# Patient Record
Sex: Female | Born: 1980 | Race: White | Hispanic: Yes | Marital: Married | Smoking: Never smoker
Health system: Southern US, Community
[De-identification: ages and names within clinical notes are randomized; demographics above are authoritative.]

## PROBLEM LIST (undated history)

## (undated) ENCOUNTER — Inpatient Hospital Stay (HOSPITAL_COMMUNITY): Payer: Self-pay

---

## 2004-01-20 ENCOUNTER — Ambulatory Visit (HOSPITAL_COMMUNITY): Admission: RE | Admit: 2004-01-20 | Discharge: 2004-01-20 | Payer: Self-pay | Admitting: Obstetrics and Gynecology

## 2004-02-15 ENCOUNTER — Ambulatory Visit (HOSPITAL_COMMUNITY): Admission: RE | Admit: 2004-02-15 | Discharge: 2004-02-15 | Payer: Self-pay | Admitting: *Deleted

## 2004-07-04 ENCOUNTER — Ambulatory Visit: Payer: Self-pay | Admitting: Family Medicine

## 2004-07-04 ENCOUNTER — Encounter (INDEPENDENT_AMBULATORY_CARE_PROVIDER_SITE_OTHER): Payer: Self-pay | Admitting: Specialist

## 2004-07-04 ENCOUNTER — Inpatient Hospital Stay (HOSPITAL_COMMUNITY): Admission: RE | Admit: 2004-07-04 | Discharge: 2004-07-07 | Payer: Self-pay | Admitting: Family Medicine

## 2005-05-25 ENCOUNTER — Emergency Department (HOSPITAL_COMMUNITY): Admission: EM | Admit: 2005-05-25 | Discharge: 2005-05-26 | Payer: Self-pay | Admitting: Emergency Medicine

## 2005-10-22 ENCOUNTER — Emergency Department (HOSPITAL_COMMUNITY): Admission: EM | Admit: 2005-10-22 | Discharge: 2005-10-22 | Payer: Self-pay | Admitting: Emergency Medicine

## 2005-10-24 ENCOUNTER — Encounter: Payer: Self-pay | Admitting: Emergency Medicine

## 2005-10-25 ENCOUNTER — Inpatient Hospital Stay (HOSPITAL_COMMUNITY): Admission: EM | Admit: 2005-10-25 | Discharge: 2005-10-26 | Payer: Self-pay | Admitting: Internal Medicine

## 2006-07-08 ENCOUNTER — Emergency Department (HOSPITAL_COMMUNITY): Admission: EM | Admit: 2006-07-08 | Discharge: 2006-07-08 | Payer: Self-pay | Admitting: Emergency Medicine

## 2009-07-24 ENCOUNTER — Emergency Department (HOSPITAL_COMMUNITY): Admission: EM | Admit: 2009-07-24 | Discharge: 2009-07-24 | Payer: Self-pay | Admitting: Emergency Medicine

## 2009-09-04 ENCOUNTER — Emergency Department (HOSPITAL_COMMUNITY): Admission: EM | Admit: 2009-09-04 | Discharge: 2009-09-04 | Payer: Self-pay | Admitting: Emergency Medicine

## 2010-08-28 ENCOUNTER — Encounter: Payer: Self-pay | Admitting: *Deleted

## 2010-10-23 LAB — COMPREHENSIVE METABOLIC PANEL
ALT: 16 U/L (ref 0–35)
AST: 18 U/L (ref 0–37)
Albumin: 3.3 g/dL — ABNORMAL LOW (ref 3.5–5.2)
Alkaline Phosphatase: 65 U/L (ref 39–117)
BUN: 11 mg/dL (ref 6–23)
CO2: 23 mEq/L (ref 19–32)
Calcium: 8.9 mg/dL (ref 8.4–10.5)
Chloride: 103 mEq/L (ref 96–112)
Creatinine, Ser: 0.61 mg/dL (ref 0.4–1.2)
GFR calc Af Amer: >60 mL/min (ref >60)
GFR calc non Af Amer: >60 mL/min (ref >60)
Glucose, Bld: 98 mg/dL (ref 70–99)
Potassium: 3.7 mEq/L (ref 3.5–5.1)
Sodium: 135 mEq/L (ref 135–145)
Total Bilirubin: 1.2 mg/dL (ref 0.3–1.2)
Total Protein: 7.4 g/dL (ref 6.0–8.3)

## 2010-10-23 LAB — URINE MICROSCOPIC-ADD ON

## 2010-10-23 LAB — CBC
HCT: 36.7 % (ref 36.0–46.0)
Hemoglobin: 12.1 g/dL (ref 12.0–15.0)
MCHC: 33 g/dL (ref 30.0–36.0)
MCV: 88.8 fL (ref 78.0–100.0)
Platelets: 226 10*3/uL (ref 150–400)
RBC: 4.13 MIL/uL (ref 3.87–5.11)
RDW: 13.5 % (ref 11.5–15.5)
WBC: 15.3 10*3/uL — ABNORMAL HIGH (ref 4.0–10.5)

## 2010-10-23 LAB — DIFFERENTIAL
Basophils Absolute: 0 10*3/uL (ref 0.0–0.1)
Basophils Relative: 0 % (ref 0–1)
Eosinophils Absolute: 0 10*3/uL (ref 0.0–0.7)
Eosinophils Relative: 0 % (ref 0–5)
Lymphocytes Relative: 10 % — ABNORMAL LOW (ref 12–46)
Lymphs Abs: 1.5 10*3/uL (ref 0.7–4.0)
Monocytes Absolute: 1.5 10*3/uL — ABNORMAL HIGH (ref 0.1–1.0)
Monocytes Relative: 10 % (ref 3–12)
Neutro Abs: 12.3 10*3/uL — ABNORMAL HIGH (ref 1.7–7.7)
Neutrophils Relative %: 81 % — ABNORMAL HIGH (ref 43–77)

## 2010-10-23 LAB — URINALYSIS, ROUTINE W REFLEX MICROSCOPIC
Bilirubin Urine: NEGATIVE
Glucose, UA: NEGATIVE mg/dL
Ketones, ur: 40 mg/dL — AB
Nitrite: POSITIVE — AB
Protein, ur: 30 mg/dL — AB
Specific Gravity, Urine: 1.017 (ref 1.005–1.030)
Urobilinogen, UA: 1 mg/dL (ref 0.0–1.0)
pH: 7.5 (ref 5.0–8.0)

## 2010-10-23 LAB — POCT PREGNANCY, URINE: Preg Test, Ur: NEGATIVE

## 2010-10-23 LAB — LIPASE, BLOOD: Lipase: 17 U/L (ref 11–59)

## 2010-11-07 LAB — COMPREHENSIVE METABOLIC PANEL
ALT: 15 U/L (ref 0–35)
AST: 20 U/L (ref 0–37)
Albumin: 3.4 g/dL — ABNORMAL LOW (ref 3.5–5.2)
Alkaline Phosphatase: 34 U/L — ABNORMAL LOW (ref 39–117)
BUN: 8 mg/dL (ref 6–23)
CO2: 23 mEq/L (ref 19–32)
Calcium: 8.9 mg/dL (ref 8.4–10.5)
Chloride: 107 mEq/L (ref 96–112)
Creatinine, Ser: 0.68 mg/dL (ref 0.4–1.2)
GFR calc Af Amer: >60 mL/min (ref >60)
GFR calc non Af Amer: >60 mL/min (ref >60)
Glucose, Bld: 100 mg/dL — ABNORMAL HIGH (ref 70–99)
Potassium: 3.5 mEq/L (ref 3.5–5.1)
Sodium: 138 mEq/L (ref 135–145)
Total Bilirubin: 0.4 mg/dL (ref 0.3–1.2)
Total Protein: 6.8 g/dL (ref 6.0–8.3)

## 2010-11-07 LAB — DIFFERENTIAL
Basophils Absolute: 0 10*3/uL (ref 0.0–0.1)
Basophils Relative: 1 % (ref 0–1)
Eosinophils Absolute: 0 10*3/uL (ref 0.0–0.7)
Eosinophils Relative: 1 % (ref 0–5)
Lymphocytes Relative: 35 % (ref 12–46)
Lymphs Abs: 2.9 10*3/uL (ref 0.7–4.0)
Monocytes Absolute: 0.4 10*3/uL (ref 0.1–1.0)
Monocytes Relative: 5 % (ref 3–12)
Neutro Abs: 5.1 10*3/uL (ref 1.7–7.7)
Neutrophils Relative %: 60 % (ref 43–77)

## 2010-11-07 LAB — CBC
HCT: 35.4 % — ABNORMAL LOW (ref 36.0–46.0)
Hemoglobin: 11.7 g/dL — ABNORMAL LOW (ref 12.0–15.0)
MCHC: 32.9 g/dL (ref 30.0–36.0)
MCV: 88.9 fL (ref 78.0–100.0)
Platelets: 221 10*3/uL (ref 150–400)
RBC: 3.98 MIL/uL (ref 3.87–5.11)
RDW: 13.1 % (ref 11.5–15.5)
WBC: 8.5 10*3/uL (ref 4.0–10.5)

## 2010-11-07 LAB — URINALYSIS, ROUTINE W REFLEX MICROSCOPIC
Bilirubin Urine: NEGATIVE
Glucose, UA: NEGATIVE mg/dL
Hgb urine dipstick: NEGATIVE
Ketones, ur: NEGATIVE mg/dL
Nitrite: NEGATIVE
Protein, ur: NEGATIVE mg/dL
Specific Gravity, Urine: 1.033 — ABNORMAL HIGH (ref 1.005–1.030)
Urobilinogen, UA: 0.2 mg/dL (ref 0.0–1.0)
pH: 7 (ref 5.0–8.0)

## 2010-11-07 LAB — LIPASE, BLOOD: Lipase: 18 U/L (ref 11–59)

## 2010-11-07 LAB — POCT PREGNANCY, URINE: Preg Test, Ur: NEGATIVE

## 2010-12-23 NOTE — Op Note (Signed)
[name], [name]          ACCOUNT NO.:  [id number]   MEDICAL RECORD NO.:  [id number]          PATIENT TYPE:  INP   LOCATION:  9120                          FACILITY:  [facility]   PHYSICIAN:  Tanya S. Shawnie Pons, M.D.   DATE OF BIRTH:  08/23/1980   DATE OF PROCEDURE:  07/04/2004  DATE OF DISCHARGE:                                 OPERATIVE REPORT   PREOPERATIVE DIAGNOSES:  1.  Intrauterine pregnancy at 39 plus weeks.  2.  Previous cesarean delivery.   POSTOPERATIVE DIAGNOSES:  1.  Intrauterine pregnancy at 39 plus weeks.  2.  Previous cesarean delivery.   PROCEDURE:  Repeat low transverse cesarean section.   SURGEON:  Shelbie Proctor. Shawnie Pons, M.D.   ASSISTANTMichele Mcalpine D. Okey Dupre, M.D.   FINDINGS:  Viable female infant, Apgar's 8 and 9, weight 9 pounds 14 ounces.   ANESTHESIA:  Spinal, Dorinda Hill T. Pamalee Leyden, M.D.   ESTIMATED BLOOD LOSS:  1000 mL.   COMPLICATIONS:  None.   INDICATIONS FOR PROCEDURE:  The patient is a 30 year old, gravida 2, para 1  with a previous cesarean delivery who desired a repeat.   DESCRIPTION OF PROCEDURE:  The patient was taken to the OR where spinal  anesthesia was administered.  When this was done, she was prepped and draped  in the usual sterile fashion, Foley catheter used to drain her bladder.  Anesthesia was __________ and this was felt to be adequate.  A Pfannenstiel  incision was made in the lower abdomen. This incision was carried down to  the underlying fascia with the electrocautery. The fascia was incised with  the electrocautery.  Two Kocher clamps were then used to elevate the  superior and inferior edges of the fascia off the underlying rectus. This  was dissected bluntly laterally with the electrocautery in the midline. The  rectus muscle was separated and the peritoneal cavity identified, the  peritoneal cavity entered bluntly. This incision was extended with the  surgeon __________ incision.  A bladder blade was then placed inside the  perineum. The  bladder was noted to be up on the uterus so a bladder flap was  made.  A knife was then used to make a low transverse incision on the  uterus. This was carried down sharply in the midline until the amniotic  cavity was reached. The incision was then bluntly extended laterally and a  sharp clamp used to rupture membranes.  Amniotic fluid was clear, the infant  was vertex and OA.  It ws delivered easily through the incision. The infant  was bulb suctioned on the abdomen and the rest of the body delivered easily.  The cord was clamped x2, cut and taken to the pediatricians. Cord blood was  obtained, the placenta was delivered from the uterine cavity. The cavity was  cleaned with dry lap pads, the edges of the incision were grasped with ring  forceps and the incision closed with a single #0 Vicryl suture in a locked  running fashion.  When hemostasis was felt to be adequate, there was a rent  noted in the peritoneum covering the bladder. This was closed with  a 3-0  Monocryl running. The bladder itself had no rent in it.  Attention was then  turned to the fascia which was closed with a #0 Vicryl suture in a running  fashion. The subcutaneous tissue was irrigated, any bleeders were cauterized  and the skin closed using clips. The incision was then infiltrated with 10  mL of 0.25% Marcaine plain. All instrument, needle and lap counts were  correct x2. The patient was awakened and taken to the recovery room in  stable condition.      TSP/MEDQ  D:  07/04/2004  T:  07/04/2004  Job:  [id number]

## 2010-12-23 NOTE — H&P (Signed)
[name], [name] NO.:  [id number]   MEDICAL RECORD NO.:  [id number]          PATIENT TYPE:  EMS   LOCATION:  ED                           FACILITY:  [facility]   PHYSICIAN:  Hettie Holstein, D.O.    DATE OF BIRTH:  May 14, 1981   DATE OF ADMISSION:  10/24/2005  DATE OF DISCHARGE:                                HISTORY & PHYSICAL   PRIMARY CARE PHYSICIAN:  Unassigned.   CHIEF COMPLAINT:  Left quadrant pain and back pain.   HISTORY OF PRESENT ILLNESS:  [name] is a pleasant Spanish-speaking 30-  year-old gravida 3, para 2 female with a history of a urinary tract  infection in the past who developed flank pain and abdominal pain beginning  Friday, as well as back pain.  She was assessed Sunday in the department and  provided hydrocodone.  The pain persisted, and she developed some nausea and  vomiting, and was unable to hold food down and was febrile, and therefore  she re-presented again today.  She underwent evaluation in the emergency  department and had a urinalysis that revealed 21-50 WBCs and 21-50 RBCs,  positive leukocyte esterase.  Urine hCG was negative.  She had a WBC count  of 16,500, and she underwent CT evaluation that revealed bilateral patchy  abnormal renal hypodensities, especially confluent to the right kidney in  the lower pole, likely representing pyelonephritis.  There was no abscess  noted.  She was initiated on antiemetics, as well as ciprofloxacin.  She is  getting blood cultures and urine cultures obtained on her fluids.   PAST MEDICAL HISTORY:  She is gravida 3, para 2.  She denies other  complaints.  She had a urinary tract infection back in November of 2005.  Denied any previous history, with the exception of a repeat cesarean  section.   She had a urinary tract infection, according to the patient's husband, about  5-6 months ago.  In any event, on Friday, she developed symptoms that were  similar to this as noted in the past.   MEDICATIONS AT HOME:  Only the narcotics prescribed this past Sunday.   ALLERGIES:  No known drug allergies.   SOCIAL HISTORY:  The patient is a homemaker.  She denies tobacco or alcohol.   FAMILY HISTORY:  Noncontributory.  The patient's husband states that her  parents are alive and well.   REVIEW OF SYSTEMS:  Unremarkable, with the exception of nausea and vomiting,  flank pain and fevers.  Otherwise, unremarkable.   PHYSICAL EXAMINATION:  VITAL SIGNS:  The patient was febrile in the  emergency department with a temperature of 103.2 and a heart rate of 119,  100% O2 saturation on room air, blood pressure 98/54.  GENERAL:  The patient is alert.  She was febrile.  She was in no acute  distress.  CARDIOVASCULAR:  Normal S1 and S2.  Tachycardic.  No appreciable murmur.  LUNGS:  Clear to auscultation bilaterally.  ABDOMEN:  Soft.  It was tender to deep palpation.  She exhibited positive  Lloyd's.  EXTREMITIES:  No edema.  NEUROLOGIC:  Revealed her to  be euthymic and her affect was stable.  No  focal neurologic deficits.   LABORATORY DATA:  As noted above.  Elevated WBC of 16,500, BUN 27,  creatinine 1.3, glucose of 96, potassium of 3.3.  CT as described above.  Urine pregnancy test was negative.   ASSESSMENT:  1.  Pyelonephritis with volume depletion and tachycardia, as well as fever.  2.  Hypokalemia.  3.  Leukocytosis.   PLAN AT THIS TIME:  We are going to admit, administer IV fluids with  repletion of her electrolytes, administer IV antibiotics, culture her and  administer analgesia.      Hettie Holstein, D.O.  Electronically Signed     ESS/MEDQ  D:  10/24/2005  T:  10/25/2005  Job:  [id number]

## 2010-12-23 NOTE — Discharge Summary (Signed)
[name], [name]          ACCOUNT NO.:  [id number]   MEDICAL RECORD NO.:  [id number]          PATIENT TYPE:  INP   LOCATION:  5506                         FACILITY:  [facility]   PHYSICIAN:  Isidor Holts, M.D.  DATE OF BIRTH:  April 05, 1981   DATE OF ADMISSION:  10/25/2005  DATE OF DISCHARGE:                                 DISCHARGE SUMMARY   PRIMARY MEDICAL DOCTOR:  Unassigned.   DISCHARGE DIAGNOSES:  1.  Acute pyelonephritis.  2.  Vomiting/dehydration.   DISCHARGE MEDICATIONS:  1.  Ciprofloxacin 500 mg p.o. b.i.d. for 1 week only.  2.  Tylenol 1 g p.o. p.r.n. q.6h. for pain for 1 week only.   PROCEDURES:  Abdominal/pelvic CT scan dated October 24, 2005. This showed  dominant abnormal enhancement in both kidneys, especially in the right  kidney lower pole, but with patchy involving in the remainder of both  kidneys. Unremarkable appearance of the pelvis.   CONSULTATIONS:  None.   ADMISSION HISTORY:  As in H&P notes of October 24, 2005, dictated by Dr. Hannah Beat. However, in brief, this is a 30 year old female, gravida 3, para 2,  with previous history of recurrent UTIs - the last one was about 5-6 months  ago - who presents with left flank pain, nausea and vomiting. She was  evaluated in the emergency department and was found to have a temperature of  103.2 and urine sediment was positive, consistent with urinary tract  infection. She was admitted for further evaluation, investigation, and  management.   CLINICAL COURSE:  #1 - ACUTE PYELONEPHRITIS. This is a young multipara, with  previous history of urinary tract infections, who presents with left flank  pain and dysuria. She was managed with intravenous fluid hydration,  analgesics, and antibiotics, with satisfactory clinical response. Initial  white cell count was 16,500 on day of presentation. However, by October 26, 2005, white cell count had normalized at 7.7. The patient has been afebrile  since October 25, 2005,  and flank pain had quickly ameliorated.   #2 - VOMITING/DEHYDRATION. This is secondary to #1 above. The patient was  managed with IV fluid hydration, antiemetics, proton pump inhibitor  treatment, and electrolyte abnormalities were adequately corrected. She was  no longer vomiting as of October 25, 2005, and has remained asymptomatic from  the point of view of GI symptoms since. As of October 26, 2005, a.m. hydration  status had normalized and the patient was able to maintain adequate oral  intake. BUN was 27 and creatinine 1.3 on day of admission. As of October 26, 2005, BUN was 6 with a creatinine of 0.9.   DISPOSITION:  The patient was deemed to be sufficiently recovered and in  satisfactory condition to be discharged on October 26, 2005. She was quite  keen to go home. She has therefore been discharged accordingly.   DIET:  No restrictions.   ACTIVITY:  As tolerated.   WOUND CARE:  Not applicable.   PAIN MANAGEMENT:  Not applicable.   FOLLOWUP INSTRUCTIONS:  The patient has been instructed to establish and  follow up with a primary M.D. for routine/preventative  care.   SPECIAL INSTRUCTIONS:  She has been strongly recommended to ensure adequate  fluid intake, approximately 2.5 L per day. All this has been communicated to  the patient and her spouse. They have verbalized understanding.      Isidor Holts, M.D.  Electronically Signed     CO/MEDQ  D:  10/26/2005  T:  10/26/2005  Job:  [id number]

## 2010-12-23 NOTE — Discharge Summary (Signed)
[name], [name]          ACCOUNT NO.:  [id number]   MEDICAL RECORD NO.:  [id number]          PATIENT TYPE:  INP   LOCATION:  9120                          [facility]ACILITY[facility]   PHYSICIAN:  Sharin Grave, MD  DATE OF BIRTH:  1980/12/04   DATE OF ADMISSION:  07/04/2004  DATE OF DISCHARGE:  07/07/2004                                 DISCHARGE SUMMARY   ADMISSION DIAGNOSES:  1.  Intrauterine pregnancy at 39 weeks' plus.  2.  Cesarean section delivery.   DISCHARGE DIAGNOSIS:  Repeat low transverse cesarean section of viable female.   DISCHARGE MEDICATIONS:  1.  Percocet 325/5 mg one to two tablets q.4-6h. p.r.n. pain.  2.  Ibuprofen 600 mg q.6h. p.r.n. for pain.  3.  Depo-Provera injections every 3 months as contraceptives.  4.  Prenatal vitamins.   DISCHARGE INSTRUCTIONS:  Nothing in vagina for 6 weeks.  Follow up  postpartum check at [facility].   BRIEF HOSPITAL COURSE:  This is a 30 year old G2, P2 now that presented with  a history of prior C-section who desired a repeat low-transverse C-section.  The patient delivered by low transverse C-section of a viable female, Apgars 8  at one minutes and 9 at six minutes, on July 04, 2004, had a great  postpartum course and was discharged on postpartum day #3.  On discharge  date, the patient was doing well.  Vitals were within normal limits.  Minimal pain and lochia, was tolerating p.o., ambulating, and denied  abdominal pain and had good urine output.   DISCHARGE LABORATORY DATA:  Blood type: Rh was O positive, RPR negative  rubella immune, hepatitis B antigens negative.  The patient was GBS  positive, HIV nonreactive.  Hemoglobin 8.8, hematocrit 24.1.   Mother and baby were discharged home in stable condition.   FOLLOWUP:  The patient was instructed to follow up at 6 weeks' postpartum at  [facility].  Also she was instructed to come back in 3-5 days for staple  removal.      AM/MEDQ  D:  09/10/2004  T:   09/10/2004  Job:  [id number]

## 2011-05-15 IMAGING — US US ABDOMEN COMPLETE
1 series · 14 of 25 positions shown · non-contrast
Comparison: CT 10/24/2005

CLINICAL DATA: Abdominal pain

COMPLETE ABDOMINAL ULTRASOUND

[Series 1: us abdomen complete · 0.28mm/px · 14 of 57 slices shown]
[im 1/57]
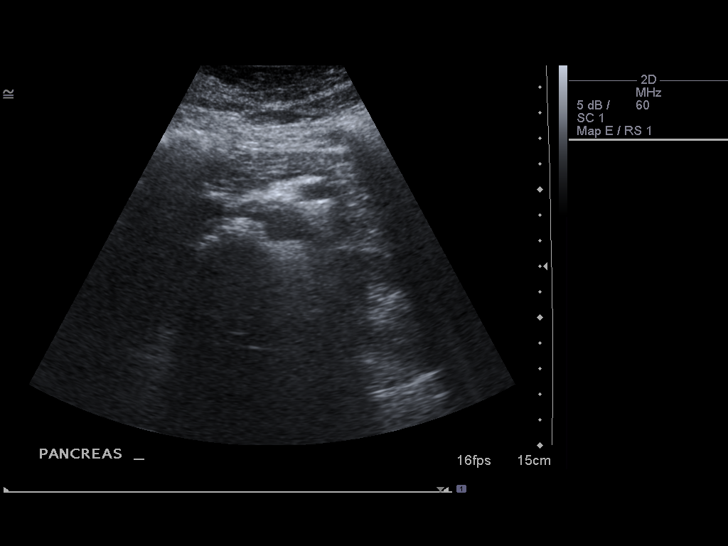
[im 5/57]
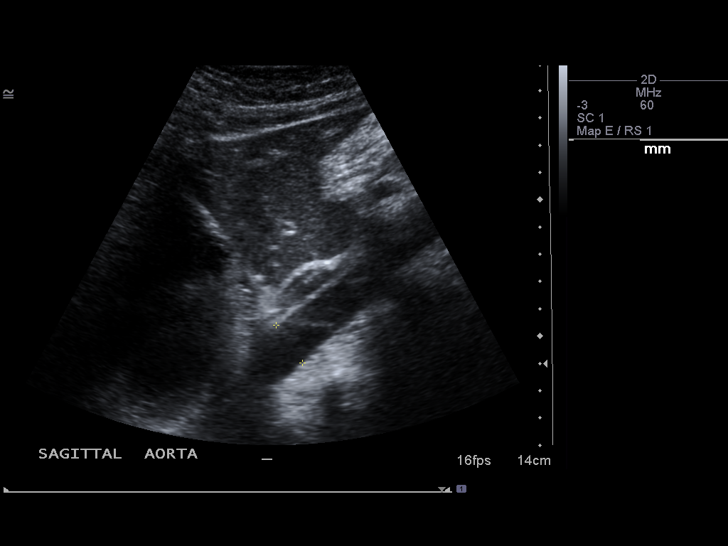
[im 10/57]
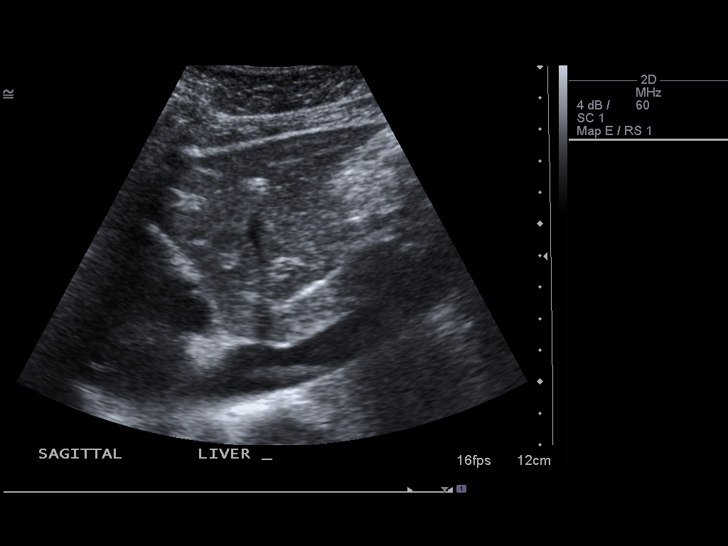
[im 15/57]
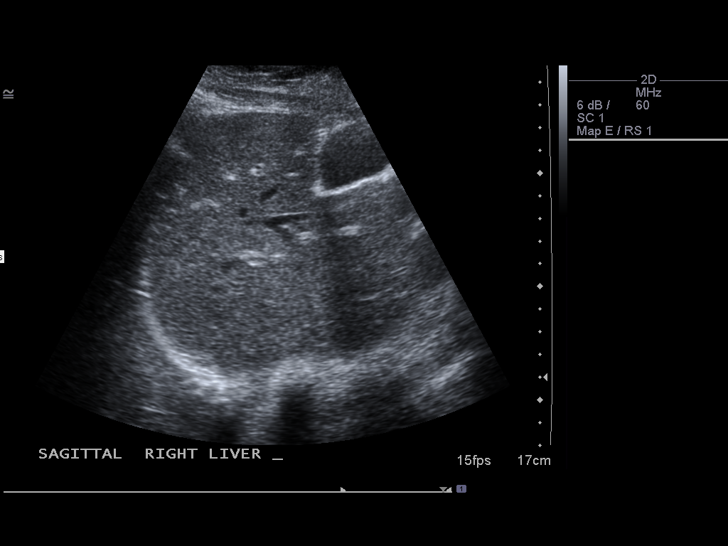
[im 19/57]
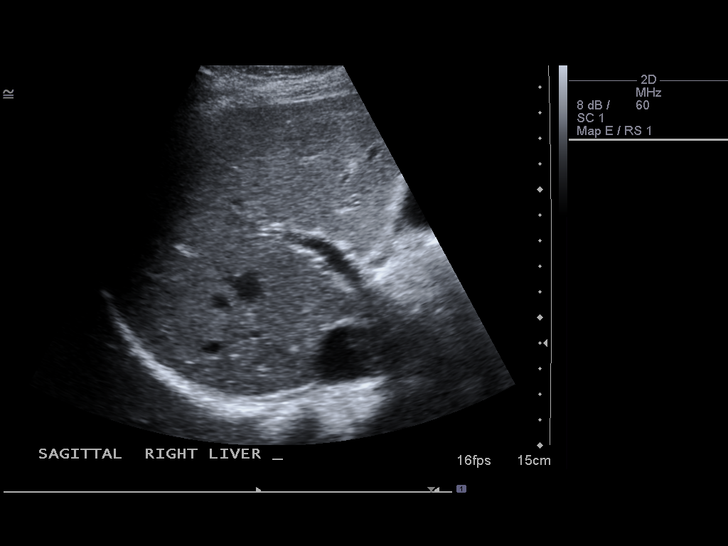
[im 22/57]
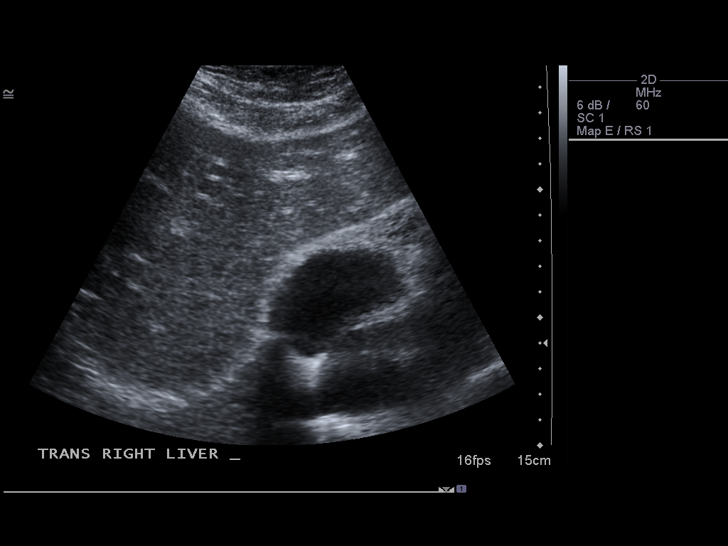
[im 26/57]
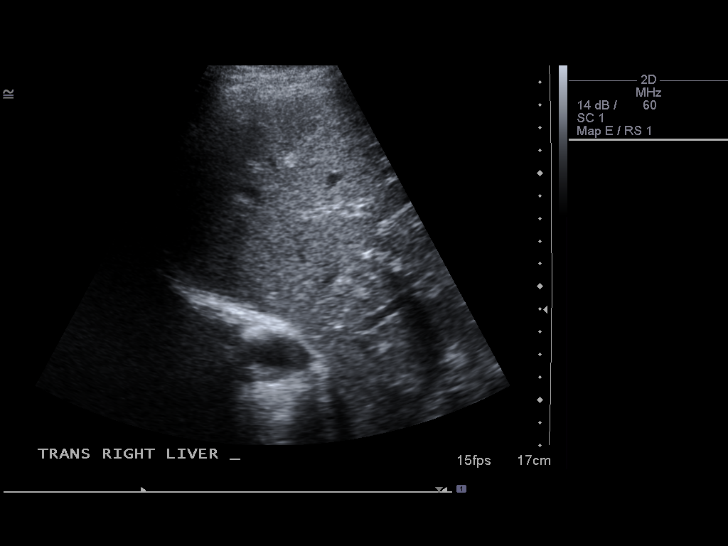
[im 31/57]
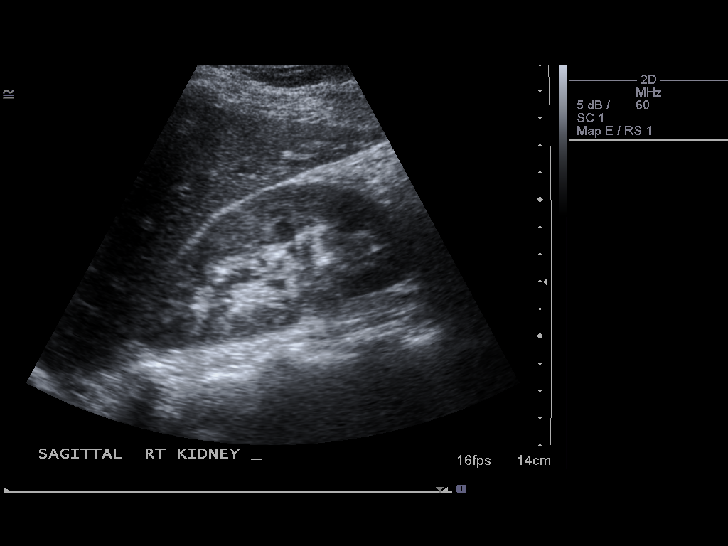
[im 36/57]
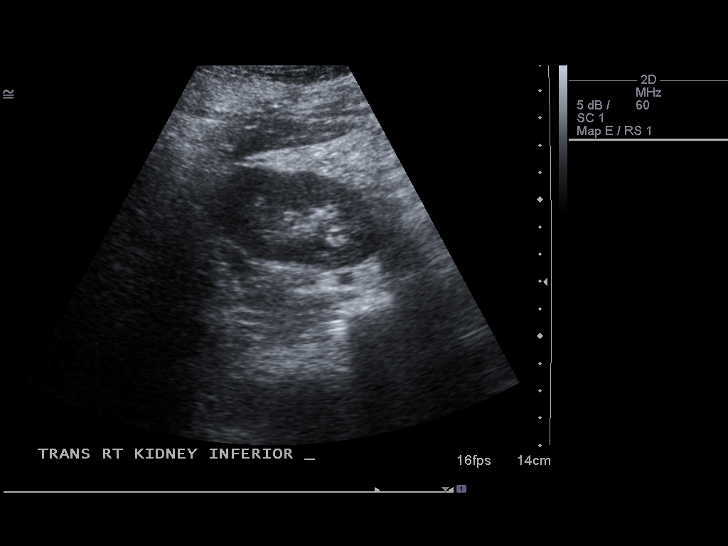
[im 38/57]
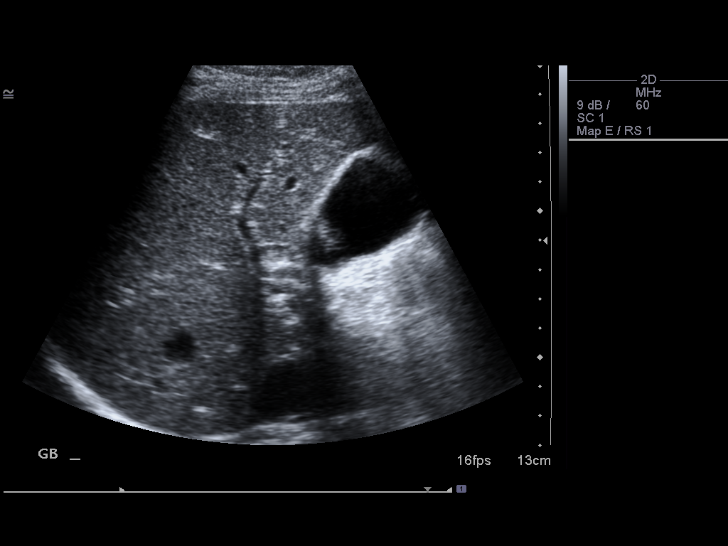
[im 43/57]
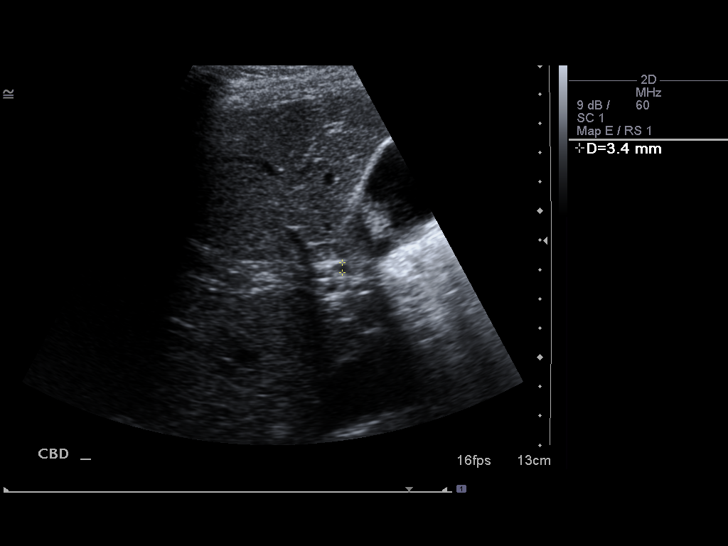
[im 47/57]
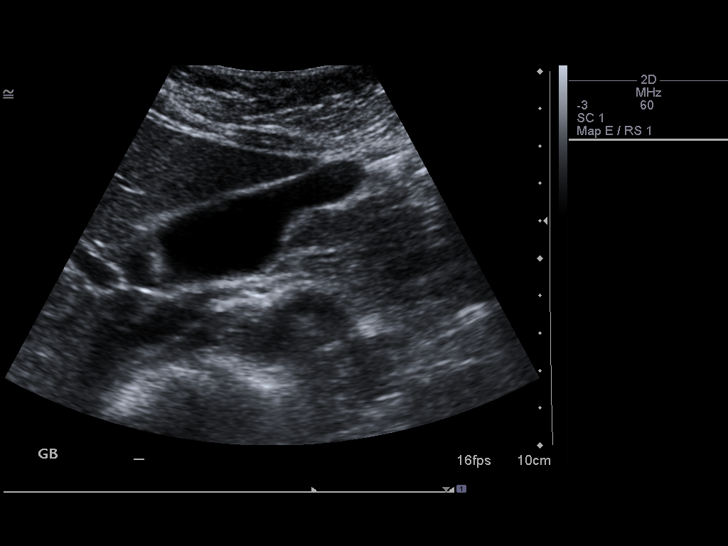
[im 52/57]
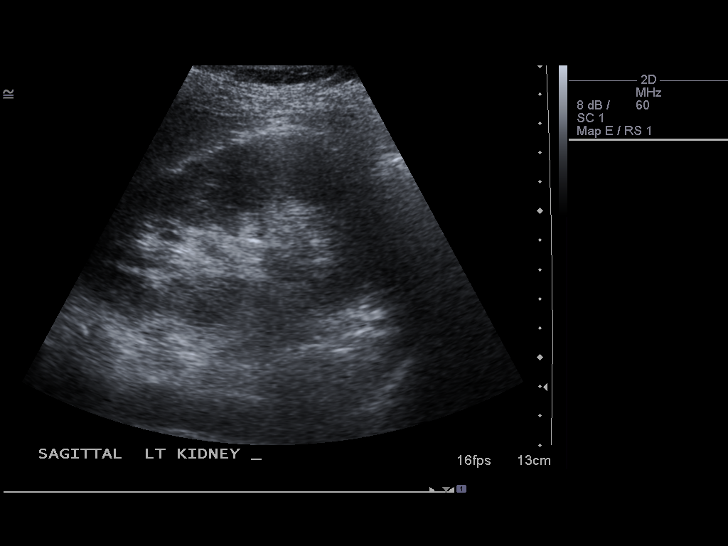
[im 57/57]
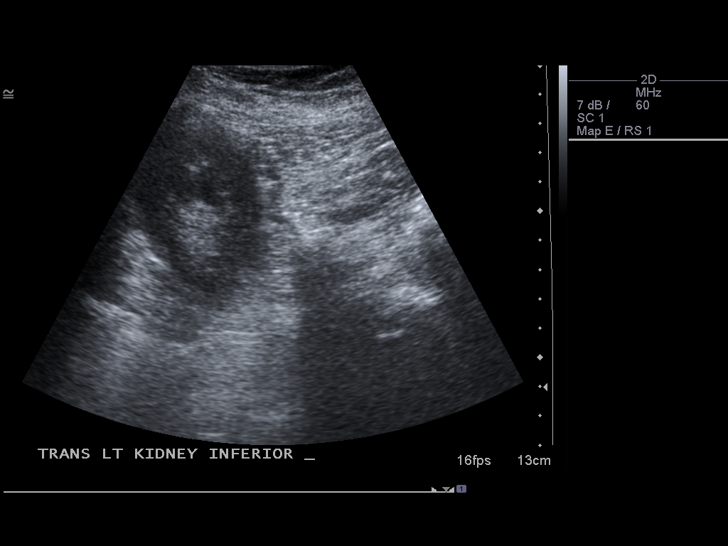

[14 of 25 positions shown; findings below may reference images not displayed]

FINDINGS: Gallbladder:  No gallstones, gallbladder wall thickening, or
pericholecystic fluid.

Common bile duct:  3.4 mm diameter, unremarkable

Liver:  No focal lesion identified.  Within normal limits in
parenchymal echogenicity.

IVC:  Appears normal.

Pancreas:  No focal abnormality seen.

Spleen:  5.8 cm in length, unremarkable

Right Kidney:  10.9 cm in length, without focal lesion or
hydronephrosis

Left Kidney:  11.2 cm in length, no lesion or hydronephrosis

Abdominal aorta:  No aneurysm identified.
IMPRESSION: Negative abdominal ultrasound.

## 2012-10-11 ENCOUNTER — Other Ambulatory Visit: Payer: Self-pay

## 2012-10-15 ENCOUNTER — Other Ambulatory Visit: Payer: Self-pay

## 2012-10-15 DIAGNOSIS — Z331 Pregnant state, incidental: Secondary | ICD-10-CM

## 2012-10-15 NOTE — Progress Notes (Signed)
PRENATAL LABS DONE TODAY [name] 

## 2012-10-16 LAB — OBSTETRIC PANEL
Antibody Screen: NEGATIVE
Basophils Relative: 0 % (ref 0–1)
Eosinophils Absolute: 0.1 10*3/uL (ref 0.0–0.7)
Eosinophils Relative: 1 % (ref 0–5)
HCT: 34.3 % — ABNORMAL LOW (ref 36.0–46.0)
Hemoglobin: 11.3 g/dL — ABNORMAL LOW (ref 12.0–15.0)
Hepatitis B Surface Ag: NEGATIVE
Lymphs Abs: 3.5 10*3/uL (ref 0.7–4.0)
MCH: 28.7 pg (ref 26.0–34.0)
MCHC: 32.9 g/dL (ref 30.0–36.0)
MCV: 87.1 fL (ref 78.0–100.0)
Monocytes Absolute: 0.7 10*3/uL (ref 0.1–1.0)
Monocytes Relative: 5 % (ref 3–12)
Neutrophils Relative %: 66 % (ref 43–77)
RBC: 3.94 MIL/uL (ref 3.87–5.11)
Rh Type: POSITIVE

## 2012-10-16 LAB — SICKLE CELL SCREEN: Sickle Cell Screen: NEGATIVE

## 2012-10-18 ENCOUNTER — Ambulatory Visit (INDEPENDENT_AMBULATORY_CARE_PROVIDER_SITE_OTHER): Payer: No Typology Code available for payment source | Admitting: Family Medicine

## 2012-10-18 ENCOUNTER — Encounter: Payer: Self-pay | Admitting: Family Medicine

## 2012-10-18 VITALS — BP 109/58 | Temp 98.8°F | Ht 64.0 in | Wt 146.1 lb

## 2012-10-18 DIAGNOSIS — Z3480 Encounter for supervision of other normal pregnancy, unspecified trimester: Secondary | ICD-10-CM

## 2012-10-18 DIAGNOSIS — Z3481 Encounter for supervision of other normal pregnancy, first trimester: Secondary | ICD-10-CM

## 2012-10-18 NOTE — Patient Instructions (Signed)
Prenatal Vitamin and Mineral Combinations (oral solid dosage forms) Qu es este medicamento? Las [employer] de VITAMINA Y MINERAL PRENATAL se utilizan [place], durante y despus de un embarazo para ayudar a suministrar una buena nutricin. Este medicamento puede ser utilizado para otros usos; si tiene alguna pregunta consulte con su proveedor de atencin mdica o con su farmacutico. Qu le debo informar a mi profesional de la salud antes de tomar este medicamento? Necesita saber si usted presenta alguno de los [employer] o situaciones: -trastorno de sangrado o coagulacin -antecedentes de anemia de cualquier tipo -otra afliccin de la salud crnica -una reaccin alrgica o inusual a vitaminas, minerales, a otros medicamentos, alimentos, colorantes o conservantes Cmo debo [employer]? Tome este medicamento por va oral con un vaso de agua. Usted puede tomarlo con o sin alimentos. Si le produce malestar estomacal, tmelo con alimentos. Las tabletas de vitaminas masticables prenatales se pueden Product manager completamente antes de tragar. Siga las instrucciones de la etiqueta del [place]. La dosis usual es una tableta una vez al da. No tome su medicamento con una frecuencia mayor a la indicada. Hable con su pediatra para informarse acerca del uso de este medicamento en nios. Puede requerir atencin especial. Este medicamento para las mujeres quienes estn [place], [place] o pueden quedar [place]. Sobredosis: Pngase en contacto inmediatamente con un centro toxicolgico o una sala de urgencia si usted cree que haya tomado demasiado medicamento. ATENCIN: [employer] es solo para usted. No comparta este medicamento con nadie. Qu sucede si me olvido de una dosis? Si olvida una dosis, tmela lo antes posible. Si es casi la hora de la prxima dosis, tome slo esa dosis. No tome dosis adicionales o dobles. Qu puede interactuar con este  medicamento? -alendronato -anticidos -cefdinir -cefditoren -etidronato -antibiticos fluoroquinolnicos (por ejemplo: ciprofloxacina, gatifloxacino, levofloxacino) -ibandronato -levodopa -risedronato -antibiticos tetraciclnicos (por ejemplo: doxiciclina, minociclina, tetraciclina) -hormonas tiroideas -warfarina Puede ser que esta lista no menciona todas las posibles interacciones. Informe a su profesional de [employer] de [employer] productos a base de hierbas, medicamentos de [place] o suplementos nutritivos que est tomando. Si usted fuma, consume bebidas alcohlicas o si utiliza drogas ilegales, indqueselo tambin a su profesional de [employer]. Algunas sustancias pueden interactuar con su medicamento. A qu debo estar atento al usar [employer]? Visite a su profesional de la salud para International aid/development worker su evolucin peridicamente de su progreso. Recuerde que los suplementos de vitamina y minerales no reemplazan la buena nutricin de [place] dieta equilibrada. Las heces cambian de color comnmente cuando se toman vitaminas y [place]. Notifique a su profesional de la salud si este cambio es preocupante o acompaado con otros sntomas, como dolor abdominal. Qu efectos secundarios puedo tener al [place] medicamento? Efectos secundarios que debe informar a su mdico o a Producer, television/film/video de la salud tan pronto como sea posible: -Therapist, art como erupcin cutnea o dificultad para respirar -vmito  Efectos secundarios que, por lo general, no requieren atencin mdica (debe informarlos a su mdico o a su profesional de la salud si persisten o si son molestos): -nuseas -Programme researcher, broadcasting/film/video Puede ser que esta lista no menciona todos los posibles efectos secundarios. Comunquese a su mdico por asesoramiento mdico [employer]. Usted puede informar los efectos secundarios a la FDA por telfono al 1-800-FDA-1088. Dnde debo guardar mi medicina? Mantngala fuera del  alcance de los nios. La mayora de las vitaminas y minerales se deben guardar a Architectural technologist. Compruebe las instrucciones de su producto. Protjala  del calor y de la humedad. Deseche todo el medicamento que no haya utilizado, despus de la fecha de vencimiento. ATENCIN: Este folleto es un resumen. Puede ser que no cubra toda la posible informacin. Si usted tiene preguntas acerca de esta medicina, consulte con su mdico, su farmacutico o su profesional de Radiographer, therapeutic.  2013, Elsevier/Gold Standard. (12/23/2010 5:23:50 PM)

## 2012-10-18 NOTE — Progress Notes (Signed)
  Subjective:    [name] is a [id number] Spanish female being seen today for her first obstetrical visit.  Her obstetrical history is significant for two prior LTCS. Patient does intend to breast feed. Pregnancy history fully reviewed. Never smoker  Patient reports decreased libido.  Filed Vitals:   10/18/12 1418  BP: 109/58  Temp: 98.8 F (37.1 C)  Weight: 146 lb 1.6 oz (66.271 kg)    HISTORY: OB History   Grav Para Term Preterm Abortions TAB SAB Ect Mult Living   3 2 2  0 0 0 0 0 0 2     # Outc Date GA Lbr Len/2nd Wgt Sex Del Anes PTL Lv   1 TRM  [redacted]w[redacted]d   M LVCS  No Yes   2 TRM  [redacted]w[redacted]d   M LVCS  No Yes   Comments: 32 y/o   3 CUR              No past medical history on file. No past surgical history on file. No family history on file.   Exam    Uterus:     Pelvic Exam:    Perineum: Normal Perineum   Vulva: normal   Vagina:  normal mucosa   pH: N/A   Cervix: no lesions   Adnexa: normal adnexa   Bony Pelvis: average  System: Breast:  normal appearance, no masses or tenderness   Skin: normal coloration and turgor, no rashes    Neurologic: oriented, normal   Extremities: normal strength, tone, and muscle mass   HEENT PERRLA and extra ocular movement intact   Mouth/Teeth mucous membranes moist, pharynx normal without lesions   Neck supple   Cardiovascular: regular rate and rhythm   Respiratory:  appears well, vitals normal, no respiratory distress, acyanotic, normal RR, ear and throat exam is normal, neck free of mass or lymphadenopathy, chest clear, no wheezing, crepitations, rhonchi, normal symmetric air entry   Abdomen: soft, non-tender; bowel sounds normal; no masses,  no organomegaly   Urinary: bladder not palpable      Assessment:    Pregnancy: [id number] Patient Active Problem List  Diagnosis  . Supervision of other normal pregnancy        Plan:   Two previous LTCS.  Will need to be seen by women's/HROB around 30-32 weeks for discussion of  further Csection Borderline anemic at 11.3 in second trimester.  Will draw at next visit and if <10.5, will start on Iron BID along with colace Anatomy US ordered Initial labs drawn. Advised pt to be taking Prenatal vitamin, has been taking for the last two months.  Gave handout for OTC at [employer] Prenatal vitamins. Problem list reviewed and updated. Genetic Screening discussed : Pt does not want due to financial cost and presentation: declined.  Ultrasound discussed; fetal survey: requested.  Follow up in 4 weeks. 50% of 60 min visit spent on counseling and coordination of care.    Gildardo Cranker 10/18/2012

## 2012-10-22 ENCOUNTER — Encounter: Payer: Self-pay | Admitting: Family Medicine

## 2012-10-25 ENCOUNTER — Ambulatory Visit (HOSPITAL_COMMUNITY)
Admission: RE | Admit: 2012-10-25 | Discharge: 2012-10-25 | Disposition: A | Discharge status: Home / Self Care | Payer: Self-pay | Source: Ambulatory Visit | Attending: Family Medicine | Admitting: Family Medicine

## 2012-10-25 DIAGNOSIS — Z3689 Encounter for other specified antenatal screening: Secondary | ICD-10-CM | POA: Insufficient documentation

## 2012-10-25 DIAGNOSIS — Z3481 Encounter for supervision of other normal pregnancy, first trimester: Secondary | ICD-10-CM

## 2012-10-25 DIAGNOSIS — O34219 Maternal care for unspecified type scar from previous cesarean delivery: Secondary | ICD-10-CM | POA: Insufficient documentation

## 2012-11-18 ENCOUNTER — Encounter: Payer: Self-pay | Admitting: Family Medicine

## 2012-11-18 ENCOUNTER — Ambulatory Visit (INDEPENDENT_AMBULATORY_CARE_PROVIDER_SITE_OTHER): Payer: No Typology Code available for payment source | Admitting: Family Medicine

## 2012-11-18 VITALS — BP 105/53 | Wt 155.0 lb

## 2012-11-18 DIAGNOSIS — Z3480 Encounter for supervision of other normal pregnancy, unspecified trimester: Secondary | ICD-10-CM

## 2012-11-18 DIAGNOSIS — Z3482 Encounter for supervision of other normal pregnancy, second trimester: Secondary | ICD-10-CM

## 2012-11-18 LAB — POCT HEMOGLOBIN: Hemoglobin: 10.3 g/dL — AB (ref 12.2–16.2)

## 2012-11-18 NOTE — Progress Notes (Signed)
Ms. Milda Smart is a [id number] @ 18.5 wk best dated by [place] presenting for f/u OB visit.  Since last visit, she is doing well c/o some lower back pain and vaginal discharge.  Vaginal d/c is about the same as last time, whitish thick, without dyparuneia or dysuria.  Occasional fetal movement, denies vaginal bleeding, contractions, lower abdominal pain.  Exam: RRR, no murmur appreciated No thyroid megaly NABS, no TTP Fetal HR 140  A/P G3P2002 at 18.1 weeks.  No current complaints, no red flags.  Will recheck H & H today as borderline last time.  Went over findings for Anatomy US done on 3/21.  Report would like repeat at 3-4 weeks but since pt has adopt a mom, unable to afford another Anatomy US.  No concerns seen on initial [place] and will continue to follow.  Will see back in 4 weeks and will schedule her to see OB clinic at 26 weeks for labs and 1 hr GTT.  Will monitor weight closely as put on 10 lbs in 5 weeks, discussed and counseled patient on this today.  [name], DO of [facility] 11/18/2012, 2:09 PM

## 2012-11-18 NOTE — Patient Instructions (Signed)
Embarazo - Segundo trimestre (Pregnancy - Second Trimester) El segundo trimestre del embarazo (del 3 al 6mes) es un perodo de evolucin rpida para usted y el beb. Hacia el final del sexto mes, el beb mide aproximadamente 23 cm y pesa 680 g. Comenzar a sentir los movimientos del beb entre las 18 y las 20 semanas de embarazo. Podr sentir las pataditas ("quickening en ingls"). Hay un rpido aumento de peso. Puede segregar un lquido claro (calostro) de las mamas. Quizs sienta pequeas contracciones en el vientre (tero) Esto se conoce como falso trabajo de parto o contracciones de Braxton-Hicks. Es como una prctica del trabajo de parto que se produce cuando el beb est listo para salir. Generalmente los problemas de vmitos matinales ya se han superado hacia el final del primer trimestre. Algunas mujeres desarrollan pequeas manchas oscuras (que se denominan cloasma, mscara del embarazo) en la cara que normalmente se van luego del nacimiento del beb. La exposicin al sol empeora las manchas. Puede desarrollarse acn en algunas mujeres embarazadas, y puede desaparecer en aquellas que ya tienen acn. EXAMENES PRENATALES  Durante los exmenes prenatales, deber seguir realizando pruebas de sangre, segn avance el embarazo. Estas pruebas se realizan para controlar su salud y la del beb. Tambin se realizan anlisis de sangre para conocer los niveles de hemoglobina. La anemia (bajo nivel de hemoglobina) es frecuente durante el embarazo. Para prevenirla, se administran hierro y vitaminas. Tambin se le realizarn exmenes para saber si tiene diabetes entre las 24 y las 28 semanas del embarazo. Podrn repetirle algunas de las pruebas que le hicieron previamente.  En cada visita le medirn el tamao del tero. Esto se realiza para asegurarse de que el beb est creciendo correctamente de acuerdo al estado del embarazo.  Tambin en cada visita prenatal controlarn su presin arterial. Esto se realiza  para asegurarse de que no tenga toxemia.  Se controlar su orina para asegurarse de que no tenga infecciones, diabetes o protena en la orina.  Se controlar su peso regularmente para asegurarse que el aumento ocurre al ritmo indicado. Esto se hace para asegurarse que usted y el beb tienen una evolucin normal.  En algunas ocasiones se realiza una prueba de ultrasonido para confirmar el correcto desarrollo y evolucin del beb. Esta prueba se realiza con ondas sonoras inofensivas para el beb, de modo que el profesional pueda calcular ms precisamente la fecha del parto. Algunas veces se realizan pruebas especializadas del lquido amnitico que rodea al beb. Esta prueba se denomina amniocentesis. El lquido amnitico se obtiene introduciendo una aguja en el vientre (abdomen). Se realiza para controlar los cromosomas en aquellos casos en los que existe alguna preocupacin acerca de algn problema gentico que pueda sufrir el beb. En ocasiones se lleva a cabo cerca del final del embarazo, si es necesario inducir al parto. En este caso se realiza para asegurarse que los pulmones del beb estn lo suficientemente maduros como para que pueda vivir fuera del tero. CAMBIOS QUE OCURREN EN EL SEGUNDO TRIMESTRE DEL EMBARAZO Su organismo atravesar numerosos cambios durante el embarazo. Estos pueden variar de una persona a otra. Converse con el profesional que la asiste acerca los cambios que usted note y que la preocupen.  Durante el segundo trimestre probablemente sienta un aumento del apetito. Es normal tener "antojos" de ciertas comidas. Esto vara de una persona a otra y de un embarazo a otro.  El abdomen inferior comenzar a abultarse.  Podr tener la necesidad de orinar con ms frecuencia debido a que   el tero y el beb presionan sobre la vejiga. Tambin es frecuente contraer ms infecciones urinarias durante el embarazo (dolor al orinar). Puede evitarlas bebiendo gran cantidad de lquidos y vaciando  la vejiga antes y despus de mantener relaciones sexuales.  Podrn aparecer las primeras estras en las caderas, abdomen y mamas. Estos son cambios normales del cuerpo durante el embarazo. No existen medicamentos ni ejercicios que puedan prevenir estos cambios.  Es posible que comience a desarrollar venas inflamadas y abultadas (varices) en las piernas. El uso de medias de descanso, elevar sus pies durante 15 minutos, 3 a 4 veces al da y limitar la sal en su dieta ayuda a aliviar el problema.  Podr sentir acidez gstrica a medida que el tero crece y presiona contra el estmago. Puede tomar anticidos, con la autorizacin de su mdico, para aliviar este problema. Tambin es til ingerir pequeas comidas 4 a 5 veces al da.  La constipacin puede tratarse con un laxante o agregando fibra a su dieta. Beber grandes cantidades de lquidos, comer vegetales, frutas y granos integrales es de gran ayuda.  Tambin es beneficioso practicar actividad fsica. Si ha sido una persona activa hasta el embarazo, podr continuar con la mayora de las actividades durante el mismo. Si ha sido menos activa, puede ser beneficioso que comience con un programa de ejercicios, como realizar caminatas.  Puede desarrollar hemorroides (vrices en el recto) hacia el final del segundo trimestre. Tomar baos de asiento tibios y utilizar cremas recomendadas por el profesional que lo asiste sern de ayuda para los problemas de hemorroides.  Tambin podr sentir dolor de espalda durante este momento de su embarazo. Evite levantar objetos pesados, utilice zapatos de taco bajo y mantenga una buena postura para ayudar a reducir los problemas de espalda.  Algunas mujeres embarazadas desarrollan hormigueo y adormecimiento de la mano y los dedos debido a la hinchazn y compresin de los ligamentos de la mueca (sndrome del tnel carpiano). Esto desaparece una vez que el beb nace.  Como sus pechos se agrandan, necesitar un sujetador  ms grande. Use un sostn de soporte, cmodo y de algodn. No utilice un sostn para amamantar hasta el ltimo mes de embarazo si va a amamantar al beb.  Podr observar una lnea oscura desde el ombligo hacia la zona pbica denominada linea nigra.  Podr observar que sus mejillas se ponen coloradas debido al aumento de flujo sanguneo en la cara.  Podr desarrollar "araitas" en la cara, cuello y pecho. Esto desaparece una vez que el beb nace. INSTRUCCIONES PARA EL CUIDADO DOMICILIARIO  Es extremadamente importante que evite el cigarrillo, hierbas medicinales, alcohol y las drogas no prescriptas durante el embarazo. Estas sustancias qumicas afectan la formacin y el desarrollo del beb. Evite estas sustancias durante todo el embarazo para asegurar el nacimiento de un beb sano.  La mayor parte de los cuidados que se aconsejan son los mismos que los indicados para el primer trimestre del embarazo. Cumpla con las citas tal como se le indic. Siga las instrucciones del profesional que lo asiste con respecto al uso de los medicamentos, el ejercicio y la dieta.  Durante el embarazo debe obtener nutrientes para usted y para su beb. Consuma alimentos balanceados a intervalos regulares. Elija alimentos como carne, pescado, leche y otros productos lcteos descremados, vegetales, frutas, panes integrales y cereales. El profesional le informar cul es el aumento de peso ideal.  Las relaciones sexuales fsicas pueden continuarse hasta cerca del fin del embarazo si no existen otros problemas. Estos   problemas pueden ser la prdida temprana (prematura) de lquido amnitico de las membranas, sangrado vaginal, dolor abdominal u otros problemas mdicos o del embarazo.  Realice actividad fsica todos los das, si no tiene restricciones. Consulte con el profesional que la asiste si no sabe con certeza si determinados ejercicios son seguros. El mayor aumento de peso tiene lugar durante los ltimos 2 trimestres del  embarazo. El ejercicio la ayudar a:  Controlar su peso.  Ponerla en forma para el parto.  Ayudarla a perder peso luego de haber dado a luz.  Use un buen sostn o como los que se usan para hacer deportes para aliviar la sensibilidad de las mamas. Tambin puede serle til si lo usa mientras duerme. Si pierde calostro, podr utilizar apsitos en el sostn.  No utilice la baera con agua caliente, baos turcos y saunas durante el embarazo.  Utilice el cinturn de seguridad sin excepcin cuando conduzca. Este la proteger a usted y al beb en caso de accidente.  Evite comer carne cruda, queso crudo, y el contacto con los utensilios y desperdicios de los gatos. Estos elementos contienen grmenes que pueden causar defectos de nacimiento en el beb.  El segundo trimestre es un buen momento para visitar a su dentista y evaluar su salud dental si an no lo ha hecho. Es importante mantener los dientes limpios. Utilice un cepillo de dientes blando. Cepllese ms suavemente durante el embarazo.  Es ms fcil perder algo de orina durante el embarazo. Apretar y fortalecer los msculos de la pelvis la ayudar con este problema. Practique detener la miccin cuando est en el bao. Estos son los mismos msculos que necesita fortalecer. Son tambin los mismos msculos que utiliza cuando trata de evitar los gases. Puede practicar apretando estos msculos 10 veces, y repetir esto 3 veces por da aproximadamente. Una vez que conozca qu msculos debe apretar, no realice estos ejercicios durante la miccin. Puede favorecerle una infeccin si la orina vuelve hacia atrs.  Pida ayuda si tiene necesidades econmicas, de asesoramiento o nutricionales durante el embarazo. El profesional podr ayudarla con respecto a estas necesidades, o derivarla a otros especialistas.  La piel puede ponerse grasa. Si esto sucede, lvese la cara con un jabn suave, utilice un humectante no graso y maquillaje con base de aceite o  crema. CONSUMO DE MEDICAMENTOS Y DROGAS DURANTE EL EMBARAZO  Contine tomando las vitaminas apropiadas para esta etapa tal como se le indic. Las vitaminas deben contener un miligramo de cido flico y deben suplementarse con hierro. Guarde todas las vitaminas fuera del alcance de los nios. La ingestin de slo un par de vitaminas o tabletas que contengan hierro puede ocasionar la muerte en un beb o en un nio pequeo.  Evite el uso de medicamentos, inclusive los de venta libre y hierbas que no hayan sido prescriptos o indicados por el profesional que la asiste. Algunos medicamentos pueden causar problemas fsicos al beb. Utilice los medicamentos de venta libre o de prescripcin para el dolor, el malestar o la fiebre, segn se lo indique el profesional que lo asiste. No utilice aspirina.  El consumo de alcohol est relacionado con ciertos defectos de nacimiento. Esto incluye el sndrome de alcoholismo fetal. Debe evitar el consumo de alcohol en cualquiera de sus formas. El cigarrillo causa nacimientos prematuros y bebs de bajo peso. El uso de drogas recreativas est absolutamente prohibido. Son muy nocivas para el beb. Un beb que nace de una madre adicta, ser adicto al nacer. Ese beb tendr los mismos   sntomas de abstinencia que un adulto.  Infrmele al profesional si consume alguna droga.  No consuma drogas ilegales. Pueden causarle mucho dao al beb. SOLICITE ATENCIN MDICA SI: Tiene preguntas o preocupaciones durante su embarazo. Es mejor que llame para consultar las dudas que esperar hasta su prxima visita prenatal. De esta forma se sentir ms tranquila.  SOLICITE ATENCIN MDICA DE INMEDIATO SI:  La temperatura oral se eleva sin motivo por encima de 102 F (38.9 C) o segn le indique el profesional que lo asiste.  Tiene una prdida de lquido por la vagina (canal de parto). Si sospecha una ruptura de las membranas, tmese la temperatura y llame al profesional para informarlo sobre  esto.  Observa unas pequeas manchas, una hemorragia vaginal o elimina cogulos. Notifique al profesional acerca de la cantidad y de cuntos apsitos est utilizando. Unas pequeas manchas de sangre son algo comn durante el embarazo, especialmente despus de mantener relaciones sexuales.  Presenta un olor desagradable en la secrecin vaginal y observa un cambio en el color, de transparente a blanco.  Contina con las nuseas y no obtiene alivio de los remedios indicados. Vomita sangre o algo similar a la borra del caf.  [place] o sube ms de 900 g. en una semana, o segn lo indicado por el profesional que la asiste.  Observa que se le hinchan el rostro, las manos, los pies o las piernas.  Ha estado expuesta a la rubola y no ha sufrido la enfermedad.  Ha estado expuesta a la quinta enfermedad o a la varicela.  Presenta dolor abdominal. Las molestias en el ligamento redondo son una causa no cancerosa (benigna) frecuente de dolor abdominal durante el embarazo. El profesional que la asiste deber evaluarla.  Presenta dolor de cabeza intenso que no se alivia.  Presenta fiebre, diarrea, dolor al orinar o le falta la respiracin.  Presenta dificultad para ver, visin borrosa, o visin doble.  Sufre una cada, un accidente de trnsito o cualquier tipo de trauma.  Vive en un hogar en el que existe violencia fsica o mental. Document Released: 05/03/2005 Document Revised: 10/16/2011 [employer] Patient Information 2013 [employer].  

## 2012-11-18 NOTE — Addendum Note (Signed)
Addended by: [place], Kare Dado on: 11/18/2012 02:28 PM   Modules accepted: Orders

## 2012-12-16 ENCOUNTER — Ambulatory Visit (INDEPENDENT_AMBULATORY_CARE_PROVIDER_SITE_OTHER): Payer: No Typology Code available for payment source | Admitting: Family Medicine

## 2012-12-16 VITALS — BP 116/48 | Temp 99.3°F | Wt 164.0 lb

## 2012-12-16 DIAGNOSIS — D509 Iron deficiency anemia, unspecified: Secondary | ICD-10-CM

## 2012-12-16 DIAGNOSIS — Z3482 Encounter for supervision of other normal pregnancy, second trimester: Secondary | ICD-10-CM

## 2012-12-16 DIAGNOSIS — Z3480 Encounter for supervision of other normal pregnancy, unspecified trimester: Secondary | ICD-10-CM

## 2012-12-16 MED ORDER — DOCUSATE SODIUM 100 MG PO CAPS: 100.0000 mg | ORAL_CAPSULE | Freq: Two times a day (BID) | ORAL | Status: DC

## 2012-12-16 MED ORDER — FERROUS SULFATE 325 (65 FE) MG PO TABS: 325.0000 mg | ORAL_TABLET | Freq: Two times a day (BID) | ORAL | Status: DC

## 2012-12-16 NOTE — Patient Instructions (Signed)
Anemia durante el embarazo (Anemia During Pregnancy) La hemoglobina es la protena de la sangre que transporta el oxgeno. Si el nivel de esta protena es muy bajo, usted est anmico. La causa ms comn es el dficit de hierro. La anemia (poca cantidad de glbulos rojos) durante el embarazo es frecuente, ya que el feto en desarrollo utilizar ms hierro y cido flico a medida que se desarrolla. [employer], el plasma (parte lquida de la sangre) aumenta aproximadamente el 50% y los glbulos rojos slo el 25%. Esto hace que disminuya la concentracin de glbulos rojos y origina durante el embarazo una situacin natural similar a la anemia. Cuando la anemia es leve puede pasar inadvertida. En los casos graves, se sentir ms cansada y Programme researcher, broadcasting/film/video. Le resultar difcil realizar actividad fsica, podra sufrir un desmayo o sentir los latidos del corazn (palpitaciones). CAUSAS Existen varias causas de anemia que pueden producirse en el embarazo.  Falta de hierro en el organismo para producir glbulos rojos (anemia por deficiencia de hierro). sta es la causa ms frecuente.  Dficit de cido flico.  Dficit de vitamina B 12.  Talasemia. Esta es una enfermedad hereditaria que sufren familias que habitan la zona del [place], [place] y los afroamericanos.  Anemia falciforme y anemia drepanoctica. Esta es una enfermedad hereditaria que sufren familias afroamericanas.  La prpura trombocitopenica inmune es la anemia que generalmente se observa en mujeres de ms de 30 aos pero tambin puede ser originada por algunos medicamentos de venta libre o prescriptos, a Actuary. La causa es un bajo recuento de plaquetas que causa hematomas en todo el cuerpo.  Trombocitopenia neonatal aloinmune es un tipo de anemia muy raro que causa un recuento bajo de plaquetas y hemorragia en el feto debido a ciertos anticuerpos que provienen de la sangre materna y que atraviesan la  placenta.  La anemia hemoltica se debe a la destruccin de glbulos rojos debido a una infeccin en la sangre, una enfermedad o ciertos medicamentos. SNTOMAS  Cansancio  Debilidad  Desmayos  Palidez  Cefaleas. DIAGNSTICO El tipo de anemia se diagnostica a partir de [place] de [place], [place], electroforesis y realizando una historia clnica familiar y personal. TRATAMIENTO Antes de administrar algn tratamiento, el mdico debe conocer la causa de la anemia. Los tratamientos posibles son:  Transfusiones de [place].  Administar glucorticoides o plaquetas cuando no son suficientes.  Administrar pastillas de hierro.  Vitamina B12  cido flico.  Extirpar el bazo. Este procedimiento se [employer] graves de trombocitopenia prpura. INSTRUCCIONES PARA EL CUIDADO DOMICILIARIO  El nutricionista o su mdico podrn ayudarla con las dudas relacionadas con la dieta.  Tome hierro y vitaminas segn se lo hayan prescripto.  Consuma una dieta rica en hierro como hgado, carne de vaca, pan integral, huevos y frutas secas.  Aumente su ingesta vitamina C. Esto har que el estmago absorba ms hierro.  Consuma vegetales de hoja verde. Son Neomia Dear buena fuente de cido flico.  Informe a su mdico si alguna persona de su familia tiene o ha tenido problemas de anemia. SOLICITE ANTENCIN MDICA SI:  Se siente muy cansada, dbil o sufre un desmayo.  Sufre cefaleas frecuentes o stas duran [employer].  Est plida.  Aparecen hematomas con facilidad. Document Released: 05/03/2005 Document Revised: 10/16/2011 [facility] Patient Information 2013 [place], [place].

## 2012-12-16 NOTE — Assessment & Plan Note (Signed)
Recommended to pt to take Fe 325 mg 1-2 x per day along with docusate due to constipation of both pregnancy/iron.

## 2012-12-16 NOTE — Progress Notes (Signed)
[name] is a [id number] @ 22.5 wk best dated by [place] presenting for f/u OB visit. Since last visit, she is doing well without vaginal d/c, vaginal bleeding, contractions, abdominal pain, diplopia or HA. Exam:  RRR, no murmur appreciated  No thyroid megaly  NABS, no TTP  Fetal HR 135 A/P  [id number] at 22.5 weeks. No current complaints, no red flags. H&H from 4/14 at 10.3, recommended once-twice daily iron with docusate. Will see back at 26 wks for 28 wk labs/1hr GTT.  Will need to go to [facility] clinic around 30-32 wks for delivery plan. Will monitor weight closely as put on 24 lbs since beginning of pregnancy.   Hayley First Paulina Fusi, DO of [facility] 12/16/2012, 2:08 PM

## 2013-01-14 ENCOUNTER — Ambulatory Visit (INDEPENDENT_AMBULATORY_CARE_PROVIDER_SITE_OTHER): Payer: No Typology Code available for payment source | Admitting: Family Medicine

## 2013-01-14 VITALS — BP 109/54 | Temp 99.3°F | Wt 171.0 lb

## 2013-01-14 DIAGNOSIS — Z3480 Encounter for supervision of other normal pregnancy, unspecified trimester: Secondary | ICD-10-CM

## 2013-01-14 DIAGNOSIS — Z3482 Encounter for supervision of other normal pregnancy, second trimester: Secondary | ICD-10-CM

## 2013-01-14 LAB — CBC
MCH: 30.9 pg (ref 26.0–34.0)
MCV: 89.2 fL (ref 78.0–100.0)
Platelets: 214 10*3/uL (ref 150–400)
RBC: 3.43 MIL/uL — ABNORMAL LOW (ref 3.87–5.11)
RDW: 15.2 % (ref 11.5–15.5)
WBC: 13.3 10*3/uL — ABNORMAL HIGH (ref 4.0–10.5)

## 2013-01-14 LAB — GLUCOSE, CAPILLARY: Glucose-Capillary: 117 mg/dL — ABNORMAL HIGH (ref 70–99)

## 2013-01-14 LAB — RPR

## 2013-01-14 NOTE — Patient Instructions (Signed)
Fetal Movement Counts Patient Name: __________________________________________________ Patient Due Date: ____________________ Performing a fetal movement count is highly recommended in high-risk pregnancies, but it is good for every pregnant woman to do. Your caregiver may ask you to start counting fetal movements at 28 weeks of the pregnancy. Fetal movements often increase:  After eating a full meal.  After physical activity.  After eating or drinking something sweet or cold.  At rest. Pay attention to when you feel the baby is most active. This will help you notice a pattern of your baby's sleep and wake cycles and what factors contribute to an increase in fetal movement. It is important to perform a fetal movement count at the same time each day when your baby is normally most active.  HOW TO COUNT FETAL MOVEMENTS 1. Find a quiet and comfortable area to sit or lie down on your left side. Lying on your left side provides the best blood and oxygen circulation to your baby. 2. Write down the day and time on a sheet of paper or in a journal. 3. Start counting kicks, flutters, swishes, rolls, or jabs in a 2 hour period. You should feel at least 10 movements within 2 hours. 4. If you do not feel 10 movements in 2 hours, wait 2 3 hours and count again. Look for a change in the pattern or not enough counts in 2 hours. SEEK MEDICAL CARE IF:  You feel less than 10 counts in 2 hours, tried twice.  There is no movement in over an hour.  The pattern is changing or taking longer each day to reach 10 counts in 2 hours.  You feel the baby is not moving as he or she usually does. Date: ____________ Movements: ____________ Start time: ____________ Finish time: ____________  Date: ____________ Movements: ____________ Start time: ____________ Finish time: ____________ Date: ____________ Movements: ____________ Start time: ____________ Finish time: ____________ Date: ____________ Movements: ____________  Start time: ____________ Finish time: ____________ Date: ____________ Movements: ____________ Start time: ____________ Finish time: ____________ Date: ____________ Movements: ____________ Start time: ____________ Finish time: ____________ Date: ____________ Movements: ____________ Start time: ____________ Finish time: ____________ Date: ____________ Movements: ____________ Start time: ____________ Finish time: ____________  Date: ____________ Movements: ____________ Start time: ____________ Finish time: ____________ Date: ____________ Movements: ____________ Start time: ____________ Finish time: ____________ Date: ____________ Movements: ____________ Start time: ____________ Finish time: ____________ Date: ____________ Movements: ____________ Start time: ____________ Finish time: ____________ Date: ____________ Movements: ____________ Start time: ____________ Finish time: ____________ Date: ____________ Movements: ____________ Start time: ____________ Finish time: ____________ Date: ____________ Movements: ____________ Start time: ____________ Finish time: ____________  Date: ____________ Movements: ____________ Start time: ____________ Finish time: ____________ Date: ____________ Movements: ____________ Start time: ____________ Finish time: ____________ Date: ____________ Movements: ____________ Start time: ____________ Finish time: ____________ Date: ____________ Movements: ____________ Start time: ____________ Finish time: ____________ Date: ____________ Movements: ____________ Start time: ____________ Finish time: ____________ Date: ____________ Movements: ____________ Start time: ____________ Finish time: ____________ Date: ____________ Movements: ____________ Start time: ____________ Finish time: ____________  Date: ____________ Movements: ____________ Start time: ____________ Finish time: ____________ Date: ____________ Movements: ____________ Start time: ____________ Finish time:  ____________ Date: ____________ Movements: ____________ Start time: ____________ Finish time: ____________ Date: ____________ Movements: ____________ Start time: ____________ Finish time: ____________ Date: ____________ Movements: ____________ Start time: ____________ Finish time: ____________ Date: ____________ Movements: ____________ Start time: ____________ Finish time: ____________ Date: ____________ Movements: ____________ Start time: ____________ Finish time: ____________  Date: ____________ Movements: ____________ Start time: ____________ Finish   time: ____________ Date: ____________ Movements: ____________ Start time: ____________ Finish time: ____________ Date: ____________ Movements: ____________ Start time: ____________ Finish time: ____________ Date: ____________ Movements: ____________ Start time: ____________ Finish time: ____________ Date: ____________ Movements: ____________ Start time: ____________ Finish time: ____________ Date: ____________ Movements: ____________ Start time: ____________ Finish time: ____________ Date: ____________ Movements: ____________ Start time: ____________ Finish time: ____________  Date: ____________ Movements: ____________ Start time: ____________ Finish time: ____________ Date: ____________ Movements: ____________ Start time: ____________ Finish time: ____________ Date: ____________ Movements: ____________ Start time: ____________ Finish time: ____________ Date: ____________ Movements: ____________ Start time: ____________ Finish time: ____________ Date: ____________ Movements: ____________ Start time: ____________ Finish time: ____________ Date: ____________ Movements: ____________ Start time: ____________ Finish time: ____________ Date: ____________ Movements: ____________ Start time: ____________ Finish time: ____________  Date: ____________ Movements: ____________ Start time: ____________ Finish time: ____________ Date: ____________ Movements:  ____________ Start time: ____________ Finish time: ____________ Date: ____________ Movements: ____________ Start time: ____________ Finish time: ____________ Date: ____________ Movements: ____________ Start time: ____________ Finish time: ____________ Date: ____________ Movements: ____________ Start time: ____________ Finish time: ____________ Date: ____________ Movements: ____________ Start time: ____________ Finish time: ____________ Date: ____________ Movements: ____________ Start time: ____________ Finish time: ____________  Date: ____________ Movements: ____________ Start time: ____________ Finish time: ____________ Date: ____________ Movements: ____________ Start time: ____________ Finish time: ____________ Date: ____________ Movements: ____________ Start time: ____________ Finish time: ____________ Date: ____________ Movements: ____________ Start time: ____________ Finish time: ____________ Date: ____________ Movements: ____________ Start time: ____________ Finish time: ____________ Date: ____________ Movements: ____________ Start time: ____________ Finish time: ____________ Document Released: 08/23/2006 Document Revised: 07/10/2012 Document Reviewed: 05/20/2012 Exit[employer] Patient Information 2014 [employer].  

## 2013-01-14 NOTE — Progress Notes (Signed)
[name] is a [id number] @ 26.6 wk best dated by [place] presenting for f/u OB visit. Since last visit, she is doing well without vaginal d/c, vaginal bleeding, contractions, abdominal pain, diplopia or HA.  Is c/o some lower back pain that she takes tylenol for but nothing severe.  Also some mild edema.  Exam:  RRR, no murmur appreciated  No thyroid megaly  NABS, no TTP  Fetal HR 155  A/P  G3P2002 at 26.6 weeks. No current complaints, no red flags. Has been taking Fe BID for the last month and the docusate without problems moving her bowel.  Will get CBC/RPR/HIV today as well to reevaluate H&H as well as STD's. Will get 1 hr GTT today as pt has put on 25 lbs since I started seeing her around 10-11 wks.  Will need to go to OB at 30 wks for birth plan as well as to discuss BTL she would like.    [name], DO of [facility] 01/14/2013, 9:38 AM

## 2013-01-14 NOTE — Progress Notes (Signed)
Also completed Medicaid pregnancy home evaluation today.   [name], DO of [facility] 01/14/2013, 10:55 AM

## 2013-01-28 ENCOUNTER — Ambulatory Visit (INDEPENDENT_AMBULATORY_CARE_PROVIDER_SITE_OTHER): Payer: No Typology Code available for payment source | Admitting: Family Medicine

## 2013-01-28 VITALS — BP 110/68 | Wt 175.0 lb

## 2013-01-28 DIAGNOSIS — Z3480 Encounter for supervision of other normal pregnancy, unspecified trimester: Secondary | ICD-10-CM

## 2013-01-28 DIAGNOSIS — Z3482 Encounter for supervision of other normal pregnancy, second trimester: Secondary | ICD-10-CM

## 2013-01-28 DIAGNOSIS — Z23 Encounter for immunization: Secondary | ICD-10-CM

## 2013-01-28 NOTE — Addendum Note (Signed)
Addended byArlyss Repress on: 01/28/2013 09:50 AM   Modules accepted: Orders

## 2013-01-28 NOTE — Progress Notes (Signed)
[name] is a [id number] @ 28.6 wk best dated by [place] presenting for f/u OB visit. Since last visit, she is doing well without vaginal d/c, vaginal bleeding, contractions, abdominal pain, diplopia, blurred vision, leg edema, or HA.   Exam:  RRR, no murmur appreciated  No thyroid megaly  NABS, no TTP  Fetal HR 145 No pedal edema B/L   A/P  G3P2002 at 28.6 weeks.  1 hr GTT and CBC/HIV/RPR were all negative and did not show concerns.  She continues on her iron BID with docusate w/o problems.  Denies bleeding discharge or abdominal pain.  Will see back in two weeks and give tdap today.  Weight stable and up 4 pounds since last visit. Will need to go to OB at 30 wks for birth plan as well as to discuss BTL she would like.   Hayley First Paulina Fusi, DO of [facility] 01/28/2013, 9:33 AM

## 2013-01-28 NOTE — Patient Instructions (Signed)
Vacuna difteria, tétanos, tos ferina (DTP) - Lo que debe saber   (Tetanus, Diphtheria, Pertussis [Tdap] Vaccine, What You Need to Know)  ¿PORQUÉ VACUNARSE?   El tétanos, la difteria y la tos ferina pueden ser enfermedades muy graves, aún en adolescentes y adultos. La vacuna Tdap nos puede proteger de estas enfermedades.   El TÉTANOS (Trismo) provoca la contracción dolorosa de los músculos, por lo general, en todo el cuerpo.   · Puede causar el endurecimiento de los músculos de la cabeza y el cuello, de modo que impide abrir la boca, tragar y en algunos casos, respirar. El tétanos causa la muerte de 1 de cada 5 personas que se infectan.  La DIFTERIA produce la formación de una membrana gruesa que cubre el fondo de la garganta.   · Puede causar problemas respiratorios, parálisis, insuficiencia cardíaca e incluso la muerte.  TOS FERINA (Pertusis) causa episodios de tos graves, que pueden hacer difícil la respiración, causar vómitos y trastornos del sueño.   · También puede ser la causa de pérdida de peso, incontinencia y fractura de costillas. Dos de cada 100 adolescentes y cinco de cada 100 adultos que enferman de pertusis deben ser hospitalizados, tienen complicaciones como la neumonía o mueren.  Estas enfermedades son provocadas por bacterias. La difteria y el pertusis se contagian de persona a persona a través de la tos o el estornudo. El tétanos ingresa al organismo a través de cortes, rasguños o heridas.   Antes de las vacunas, en los [place] se vieron más de 200.000 casos al año de difteria y tos ferina y cientos de casos de tétanos. Desde el inicio de la vacunación, los casos de tétanos y difteria han disminuido alrededor del 99% y los casos de tos ferina alrededor del 80%.   Tdap   La vacuna Tdap protege a adolescentes y adultos contra el tétanos, la difteria y la tos ferina. Una dosis de Tdap se administra a los 11 o 12 años de edad. Las personas que no recibieron la vacuna Tdap a esa edad deben  recibirla tan pronto como sea posible.   Es muy importante que los profesionales de la salud y todos aquellos que tengan contacto cercano con bebés menores de 12 meses reciban la Tdap.   Las mujeres embarazadas deben recibir una dosis de Tdap en cada embarazo, para proteger al recién nacido de la tos ferina. Los niños tienen mayor riesgo de complicaciones graves y potencialmente mortales debido a la tos ferina.   Una vacuna similar, llamada Td, protege contra el tétanos y la difteria, pero no contra la tos ferina. Cada 10 años debe recibirse un refuerzo de Td. La Tdap se puede administrar como uno de estos refuerzos, si todavía no ha recibido una dosis. También se puede aplicar después de un corte o quemadura grave para prevenir la infección por tétanos.   El médico le dará más información.   La Tdap puede administrarse de manera segura simultáneamente con otras vacunas.   ALGUNAS PERSONAS NO DEBEN RECIBIR ESTA VACUNA.   · Si alguna vez tuvo una reacción alérgica potencialmente mortal después de una dosis de la vacuna contra el tétanos, la diferia o la tos ferina, o tuvo una alergia grave a cualquiera de los componentes de esta vacuna, no debe aplicarse la vacuna. Informe a su médico si usted sufre algún tipo de alergia grave.  · Si estuvo en coma o sufrió múltiples convulsiones dentro de los 7 días posteriores después de una dosis de DTP o DTaP   no debe recibir la Tdap, salvo que se encuentre otra causa En este caso puede recibir la Td.  · Consulte con su médico si:  · tiene epilepsia u otra enfermedad del sistema nervioso,  · siente dolor intenso o se hincha después de recibir cualquier vacuna contra la difteria, el tétanos o la tos ferina,  · alguna vez ha sufrido el síndrome de Guillain-Barré,  · no se siente bien el día en que se ha programado la vacuna.  RIESGOS DE UNA REACCIÓN A LA VACUNA  Con cualquier medicamento, incluyendo las vacunas, existe la posibilidad de que aparezcan efectos secundarios. Estos son  leves y desaparecen por sí solos, pero también son posibles las reacciones graves.   Breves episodios de desmayo pueden seguir a una vacunación, causando lesiones por la caída. Sentarse o recostarse durante 15 minutos puede ayudar a evitarlo. Informe al médico si se siente mareado o aturdido, tiene cambios en la visión o zumbidos en los oídos.   Problemas leves luego de la Tdap (no interferirán con las actividades)   · Dolor en el sitio de la inyección (alrededor de 1 de cada 4 adolescentes o 2 de cada 3 adultos).  · Enrojecimiento o hinchazón en el lugar de la inyección (1 de cada 5 personas).  · Fiebre leve de al menos 100,4° F (38° C) (hasta alrededor de 1 cada 25 adolescentes y 1 de cada 100 adultos).  · Dolor de cabeza (3 o 4de cada 10 personas).  · Cansancio (1 de cada 3 o 4 personas).  · Náuseas, vómitos, diarrea, dolor de estómago (1 de cada 4 adolescentes o 1 de cada 10 adultos).  · Escalofríos, dolores corporales, dolor articular, erupciones, inflamación de las glándulas (poco frecuente).  Problemas moderados: (interfieren con las actividades, pero no requieren atención médica)   · Dolor en el lugar de la inyección (1 de cada 5 adolescentes o 1 de cada 100 adultos).  · Enrojecimiento o inflamación (1 de cada 16 adolescentes y 1 de cada 25 adultos).  · Fiebre de más de 102°F o 38,9°C (1 de cada 100 adolescentes o 1 de cada 250 adultos).  · Dolor de cabeza (alrededor de 4 de cada 20 adolescentes y 3 de cada 10 adultos).  · Náuseas, vómitos, diarrea, dolor de estómago (1 a 3 de cada 100 personas).  · Hinchazón de todo el brazo en el que se aplicó la vacuna (3 de cada100 personas).  Problemas graves: luego de la Tdap (no puede realizar las actividades habituales, requiere atención médica)   · Inflamación, dolor intenso, sangrado y enrojecimiento en el brazo, en el sitio de la inyección (poco frecuente).  Una reacción alérgica grave puede ocurrir después de la administración de cualquier vacuna (se estima en  menos de 1 en un millón de dosis).   ¿QUÉ PASA SI HAY UNA REACCIÓN GRAVE?   ¿Qué signos debo buscar?  · Observe todo lo que le preocupe, como signos de una reacción alérgica grave, fiebre muy alta o cambios en el comportamiento.  Los signos de una reacción alérgica grave pueden incluir urticaria, hinchazón de la cara y la garganta, dificultad para respirar, ritmo cardíaco acelerado, mareos y debilidad. Estos síntomas pueden comenzar entre unos pocos minutos y algunas horas después de la vacunación.   ¿Qué debo hacer?  · Si usted piensa que se trata de una reacción alérgica grave o de otra emergencia que no puede esperar, llame al 911 o lleve a la persona al hospital más cercano. De lo contrario, llame   a su médico.  · Después, la reacción debe informarse a la "Vaccine Adverse Event Reporting System" (Sistema de información sobre efectos adversos de las vacunas -VAERS). El médico o usted mismo pueden realizar el informe en el sitio web del VAERS www.vaers.hhs.govo llame al 1-800-822-7967.  El VAERS es sólo para informar reacciones. No brindan consejo médico.   PROGRAMA NACIONAL DE COMPENSACIÓN DE DAÑOS POR VACUNAS   El National Vaccine Injury Compensation Program (VICP) es un programa federal que fue creado para compensar a las personas que puedan haber sufrido daños al recibir ciertas vacunas.   Aquellas personas que consideren que han sufrido un daño como consecuencia de una vacuna y quieren saber más acerca del programa y como presentar una denuncia, pueden llamar 1-800-338-2382 o visite el sitio web del VICP en www.hrsa.gov/vaccinecompensation.   ¿CÓMO PUEDO OBTENER MÁS INFORMACIÓN?   · Consulte a su médico.  · Comuníquese con el servicio de salud de su localidad o su estado.  · Comuníquese con los Centros para el control y la prevención de enfermedades (Centers for Disease Control and Prevention , CDC).  · llamando al 1-800-232-4636 o visitando el sitio web del CDC en www.cdc.gov/vaccines.  CDC Tdap Vaccine VIS  (12/14/11)   Document Released: 07/10/2012  ExitCare® Patient Information ©2014 [employer].

## 2013-02-13 ENCOUNTER — Ambulatory Visit (INDEPENDENT_AMBULATORY_CARE_PROVIDER_SITE_OTHER): Payer: No Typology Code available for payment source | Admitting: Family Medicine

## 2013-02-13 VITALS — BP 111/54 | Temp 99.3°F | Wt 177.0 lb

## 2013-02-13 DIAGNOSIS — Z3480 Encounter for supervision of other normal pregnancy, unspecified trimester: Secondary | ICD-10-CM

## 2013-02-13 DIAGNOSIS — Z3483 Encounter for supervision of other normal pregnancy, third trimester: Secondary | ICD-10-CM

## 2013-02-13 NOTE — Progress Notes (Signed)
[name] is a [id number] @ 31.1 wk best dated by [place] presenting for f/u OB visit. Since last visit, she is doing well without vaginal d/c, vaginal bleeding, contractions, abdominal pain, diplopia, blurred vision, worsening leg edema, or HA.   Exam:  RRR, no murmur appreciated  No thyroid megaly  NABS, no TTP  Fetal HR 145  Trace pedal edema B/L   A/P  G3P2002 at 31.1 wks w/o complaints today. Labwork to this point has been normal and she has not concerns at this visit. She continues on her iron BID with docusate w/o problems. Denies bleeding discharge or abdominal pain. Will see back in two weeks, weight stable, and will send to [facility] clinic for evaluation of birth plan for another LTCS and possible BTL.    Twana First Fahed Morten, DO of [facility] 02/13/2013, 10:04 AM

## 2013-02-24 ENCOUNTER — Telehealth: Payer: Self-pay | Admitting: Family Medicine

## 2013-02-24 NOTE — Telephone Encounter (Signed)
Pt appt at [facility] w/Dr Debroah Loop resched for Wed July 23 at 9:30. Husband is given info and will advise wife. Told she is to be see at "Dr Adele Schilder office" not OB Clinic.

## 2013-02-28 ENCOUNTER — Ambulatory Visit (INDEPENDENT_AMBULATORY_CARE_PROVIDER_SITE_OTHER): Payer: No Typology Code available for payment source | Admitting: Family Medicine

## 2013-02-28 VITALS — BP 108/53 | Temp 98.8°F | Wt 182.5 lb

## 2013-02-28 DIAGNOSIS — Z3480 Encounter for supervision of other normal pregnancy, unspecified trimester: Secondary | ICD-10-CM

## 2013-02-28 DIAGNOSIS — Z3483 Encounter for supervision of other normal pregnancy, third trimester: Secondary | ICD-10-CM

## 2013-02-28 NOTE — Progress Notes (Signed)
[name] is a [id number] @ 33.2 wk best dated by [place] presenting for f/u OB visit. Since last visit, she is doing well without vaginal d/c, vaginal bleeding, contractions, abdominal pain, diplopia, blurred vision, worsening leg edema, or HA.   Exam:   Filed Vitals:   02/28/13 1507  BP: 108/53  Temp: 98.8 F (37.1 C)    RRR, no murmur appreciated  No thyroid megaly  NABS, no TTP  Fetal HR 135 Trace pedal edema B/L   A/P  G3P2002 at 33.2 wks w/o complaints today. Labwork to this point has been normal and she has not concerns at this visit. She continues on her iron BID with docusate w/o problems. Will continue to see back q 2 weeks until 36 wks when we will get GC/Chlamydia and GBS.  Did have appointment with Dr. Debroah Loop from [facility] to set up induction for September 3rd 2014.   [name], DO of [facility] 02/28/2013, 5:43 PM

## 2013-02-28 NOTE — Patient Instructions (Signed)
Induccin del trabajo de parto  (Labor Induction) La mayora de las mujeres comienza el trabajo de parto sin ayuda entre las 37 y 42 semanas del embarazo. Cuando esto no sucede, o cuando hay una necesidad mdica, pueden utilizarse medicamentos u otros mtodos para inducirlo. La induccin al parto hace que el tero de la mujer embarazada se contraiga. Tambin hace que el cuello uterino se ablande (madure), se abra (se dilate), y se afine (se borre). Por lo general, el trabajo de parto no es inducido antes de las 39 semanas del embarazo excepto que haya un problema con el beb o de la madre.  Que en su caso sea inducido depende de ciertos factores, por ejemplo:   Su estado de salud y el de su beb.  Cuntas semanas tiene de embarazo.  El estado de madurez de los pulmones del beb.  El estado del cuello uterino.  La posicin del beb. MOTIVOS PARA INDUCIR EL TRABAJO DE PARTO   La salud del beb o de la madre estn en riesgo.  El embarazo se ha pasado 1 semana o ms.  Se ha roto la bolsa de aguas, pero el parto no empieza por s mismo.  La madre tiene un problema de salud o una enfermedad grave, como hipertensin arterial, infeccin, desprendimiento de la placenta o diabetes.  Hay poca cantidad de lquido amnitico alrededor del beb.  El beb tiene sufrimiento fetal. MOTIVOS PARA NO INDUCIR EL TRABAJO DE PARTO  La induccin del parto puede no ser una buena idea si:   Se observa que su beb no tolerar el trabajo de parto.  La induccin es slo ms conveniente.  Usted quiere que el beb nazca en una fecha determinada, como un da de fiesta.  Ha tenido cirugas previas en el tero, como una miomectoma o la extraccin de fibromas.  La placenta se encuentra muy baja en el tero y obstruye la abertura del cuello del tero (placenta previa).  El beb no est en posicin de cabeza.  El cordn umbilical cae por el canal del parto, frente al beb. En este caso podra cortarse el  suministro de sangre de oxgeno al beb.  Usted ha tenido antes un parto por cesrea.  Hay circunstancias inusuales, como que el beb sea muy prematuro. RIESGOS Y COMPLICACIONES  Pueden ocurrir problemas en el proceso de induccin y podr ser necesario modificar los planes segn desarrollo de los acontecimientos. Algunos de los riesgos de la induccin son:   Cambio en la frecuencia cardaca fetal, tales como que sea muy alta, muy baja, o errtica.  Riesgo de sufrimiento fetal.  Riesgo de infeccin para la madre y el beb.  Aumento de la posibilidad de tener un parto por cesrea.  Posibilidad rara, pero aumentada de que la placenta se separe del tero (desprendimiento).  Rotura uterina (muy raro). Cuando la induccin es necesaria por razones mdicas, los beneficios de la induccin pueden ser mayores que los riesgos.  ANTES DEL PROCEDIMIENTO  El mdico controlar el cuello y la posicin del beb. Esto lo ayudar a decidir si est en condiciones de una induccin al parto.  PROCEDIMIENTO  Se pueden utilizar varios mtodos de induccin del parto, como:   Tomar medicamentos como prostaglandinas para dilatar y madurar el cuello del tero. Este medicamento tambin iniciar las contracciones. Podr ser administrado por va oral o mediante la insercin de un supositorio en la vagina.  Podrn insertarle un tubo delgado (catter) con un globo en el extremo en la vagina para dilatar   el cuello del tero. Una vez insertado, el globo se expande con agua, lo que hace que el cuello uterino se abra.  Ruptura de las membranas. El mdico inserta un dedo entre el cuello del tero y las membranas, lo que hace que el cuello del tero se estire y podr hacer que el tero se contraiga. Esto se suele hacer durante una visita al consultorio. Se la enviar a su casa a esperar a que las contracciones comiencen. Luego vendr para la induccin.  Ruptura de la bolsa de aguas. Su mdico har un agujero en el saco  amnitico con un instrumento pequeo. Una vez que se rompe el saco amnitico, las contracciones deben comenzar. Pueden pasar algunas horas para ver su efecto.  Tomar medicamentos para desencadenar o reforzar las contracciones. Este medicamento se administra por va intravenosa a travs de un tubo en el brazo. Todos los mtodos de induccin, adems la rotura de membranas, se realizan en el hospital. La induccin se realiza en el hospital, de modo que usted y el beb sean atentamente controlados.  DESPUS DEL PROCEDIMIENTO  Algunas inducciones pueden llevar hasta 2 o 3 das. Dependiendo del cuello del tero, por lo general toma menos tiempo. Se tarda ms tiempo cuando se induce al inicio del embarazo o si es su primer embarazo. Si la madre est todava embarazada y han realizado la induccin durante 2 a 3 das, ser necesario que sea enviada a su casa o que tenga un parto por cesrea.  Document Released: 10/31/2007 Document Revised: 10/16/2011 Exit[employer] Patient Information 2014 [employer].  

## 2013-03-12 ENCOUNTER — Ambulatory Visit (INDEPENDENT_AMBULATORY_CARE_PROVIDER_SITE_OTHER): Payer: No Typology Code available for payment source | Admitting: Family Medicine

## 2013-03-12 VITALS — BP 109/56 | Temp 99.1°F | Wt 183.0 lb

## 2013-03-12 DIAGNOSIS — Z3483 Encounter for supervision of other normal pregnancy, third trimester: Secondary | ICD-10-CM

## 2013-03-12 DIAGNOSIS — Z3480 Encounter for supervision of other normal pregnancy, unspecified trimester: Secondary | ICD-10-CM

## 2013-03-12 NOTE — Patient Instructions (Signed)
Evaluacin de los movimientos fetales  (Fetal Movement Counts) Nombre del paciente: __________________________________________________ Fecha de parto estimada: ____________________ La evaluacin de los movimientos fetales es muy recomendable en los embarazos de alto riesgo, pero tambin es una buena idea que lo hagan todas las embarazadas. El mdico le indicar que comience a contarlos a las 28 semanas de embarazo. Los movimientos fetales suelen aumentar:   Despus de una comida completa.  Despus de la actividad fsica.  Despus de comer o beber algo dulce o fro.  En reposo. Preste atencin cuando sienta que el beb est ms activo. Esto le ayudar a notar un patrn de ciclos de vigilia y sueo de su beb y cules son los factores que contribuyen a un aumento de los movimientos fetales. Es importante llevar a cabo un recuento de movimientos fetales, al mismo tiempo cada da, cuando el beb normalmente est ms activo.  CMO CONTAR LOS MOVIMIENTOS FETALES 1. Busque un lugar tranquilo y cmodo para sentarse o recostarse sobre el lado izquierdo. Al recostarse sobre su lado izquierdo, le proporciona una mejor circulacin de sangre y oxgeno al beb. 2. Anote el da y la hora en una hoja de papel o en un diario. 3. Comience contando las pataditas, revoloteos, chasquidos, vueltas o pinchazos en un perodo de 2 horas. Debe sentir al menos 10 movimientos en 2 horas. 4. Si no siente 10 movimientos en 2 horas, espere 2  3 horas y cuente de nuevo. Busque cambios en el patrn o si no cuenta lo suficiente en 2 horas. SOLICITE ATENCIN MDICA SI:   Siente menos de 10 pataditas en 2 horas, en dos intentos.  No hay movimientos durante una hora.  El patrn se modifica o le lleva ms tiempo cada da contar las 10 pataditas.  Siente que el beb no se mueve como lo hace habitualmente. Fecha: ____________ Movimientos: ____________ Hora de inicio: ____________ Hora de finalizacin: ____________  Fecha:  ____________ Movimientos: ____________ Hora de inicio: ____________ Hora de finalizacin: ____________  Fecha: ____________ Movimientos: ____________ Hora de inicio: ____________ Hora de finalizacin: ____________  Fecha: ____________ Movimientos: ____________ Hora de inicio: ____________ Hora de finalizacin: ____________  Fecha: ____________ Movimientos: ____________ Hora de inicio: ____________ Hora de finalizacin: ____________  Fecha: ____________ Movimientos: ____________ Hora de inicio: ____________ Hora de finalizacin: ____________  Fecha: ____________ Movimientos: ____________ Hora de inicio: ____________ Hora de finalizacin: ____________  Fecha: ____________ Movimientos: ____________ Hora de inicio: ____________ Hora de finalizacin: ____________  Fecha: ____________ Movimientos: ____________ Hora de inicio: ____________ Hora de finalizacin: ____________  Fecha: ____________ Movimientos: ____________ Hora de inicio: ____________ Hora de finalizacin: ____________  Fecha: ____________ Movimientos: ____________ Hora de inicio: ____________ Hora de finalizacin: ____________  Fecha: ____________ Movimientos: ____________ Hora de inicio: ____________ Hora de finalizacin: ____________  Fecha: ____________ Movimientos: ____________ Hora de inicio: ____________ Hora de finalizacin: ____________  Fecha: ____________ Movimientos: ____________ Hora de inicio: ____________ Hora de finalizacin: ____________  Fecha: ____________ Movimientos: ____________ Hora de inicio: ____________ Hora de finalizacin: ____________  Fecha: ____________ Movimientos: ____________ Hora de inicio: ____________ Hora de finalizacin: ____________  Fecha: ____________ Movimientos: ____________ Hora de inicio: ____________ Hora de finalizacin: ____________  Fecha: ____________ Movimientos: ____________ Hora de inicio: ____________ Hora de finalizacin: ____________  Fecha: ____________ Movimientos: ____________ Hora  de inicio: ____________ Hora de finalizacin: ____________  Fecha: ____________ Movimientos: ____________ Hora de inicio: ____________ Hora de finalizacin: ____________  Fecha: ____________ Movimientos: ____________ Hora de inicio: ____________ Hora de finalizacin: ____________  Fecha: ____________ Movimientos: ____________ Hora de inicio: ____________ Hora de   finalizacin: ____________  Fecha: ____________ Movimientos: ____________ Hora de inicio: ____________ Hora de finalizacin: ____________  Fecha: ____________ Movimientos: ____________ Hora de inicio: ____________ Hora de finalizacin: ____________  Fecha: ____________ Movimientos: ____________ Hora de inicio: ____________ Hora de finalizacin: ____________  Fecha: ____________ Movimientos: ____________ Hora de inicio: ____________ Hora de finalizacin: ____________  Fecha: ____________ Movimientos: ____________ Hora de inicio: ____________ Hora de finalizacin: ____________  Fecha: ____________ Movimientos: ____________ Hora de inicio: ____________ Hora de finalizacin: ____________  Fecha: ____________ Movimientos: ____________ Hora de inicio: ____________ Hora de finalizacin: ____________  Fecha: ____________ Movimientos: ____________ Hora de inicio: ____________ Hora de finalizacin: ____________  Fecha: ____________ Movimientos: ____________ Hora de inicio: ____________ Hora de finalizacin: ____________  Fecha: ____________ Movimientos: ____________ Hora de inicio: ____________ Hora de finalizacin: ____________  Fecha: ____________ Movimientos: ____________ Hora de inicio: ____________ Hora de finalizacin: ____________  Fecha: ____________ Movimientos: ____________ Hora de inicio: ____________ Hora de finalizacin: ____________  Fecha: ____________ Movimientos: ____________ Hora de inicio: ____________ Hora de finalizacin: ____________  Fecha: ____________ Movimientos: ____________ Hora de inicio: ____________ Hora de finalizacin:  ____________  Fecha: ____________ Movimientos: ____________ Hora de inicio: ____________ Hora de finalizacin: ____________  Fecha: ____________ Movimientos: ____________ Hora de inicio: ____________ Hora de finalizacin: ____________  Fecha: ____________ Movimientos: ____________ Hora de inicio: ____________ Hora de finalizacin: ____________  Fecha: ____________ Movimientos: ____________ Hora de inicio: ____________ Hora de finalizacin: ____________  Fecha: ____________ Movimientos: ____________ Hora de inicio: ____________ Hora de finalizacin: ____________  Fecha: ____________ Movimientos: ____________ Hora de inicio: ____________ Hora de finalizacin: ____________  Fecha: ____________ Movimientos: ____________ Hora de inicio: ____________ Hora de finalizacin: ____________  Fecha: ____________ Movimientos: ____________ Hora de inicio: ____________ Hora de finalizacin: ____________  Fecha: ____________ Movimientos: ____________ Hora de inicio: ____________ Hora de finalizacin: ____________  Fecha: ____________ Movimientos: ____________ Hora de inicio: ____________ Hora de finalizacin: ____________  Fecha: ____________ Movimientos: ____________ Hora de inicio: ____________ Hora de finalizacin: ____________  Fecha: ____________ Movimientos: ____________ Hora de inicio: ____________ Hora de finalizacin: ____________  Fecha: ____________ Movimientos: ____________ Hora de inicio: ____________ Hora de finalizacin: ____________  Fecha: ____________ Movimientos: ____________ Hora de inicio: ____________ Hora de finalizacin: ____________  Fecha: ____________ Movimientos: ____________ Hora de inicio: ____________ Hora de finalizacin: ____________  Fecha: ____________ Movimientos: ____________ Hora de inicio: ____________ Hora de finalizacin: ____________  Fecha: ____________ Movimientos: ____________ Hora de inicio: ____________ Hora de finalizacin: ____________  Fecha: ____________  Movimientos: ____________ Hora de inicio: ____________ Hora de finalizacin: ____________  Fecha: ____________ Movimientos: ____________ Hora de inicio: ____________ Hora de finalizacin: ____________  Fecha: ____________ Movimientos: ____________ Hora de inicio: ____________ Hora de finalizacin: ____________  Document Released: 10/31/2007 Document Revised: 07/10/2012 ExitCare Patient Information 2014 [employer].  

## 2013-03-12 NOTE — Progress Notes (Signed)
Ms. Milda Smart is a [id number] @ 35.0 wk best dated by [place] presenting for f/u OB visit. Since last visit, she is doing well without vaginal d/c, vaginal bleeding, contractions, abdominal pain, diplopia, blurred vision, worsening leg edema, or HA.   Exam:  Filed Vitals:   03/12/13 1416  BP: 109/56  Temp: 99.1 F (37.3 C)   RRR, no murmur appreciated  No thyroid megaly  NABS, no TTP  Fetal HR 135  Trace pedal edema B/L   A/P  G3P2002 at 35.0 wks w/o complaints today. Labwork to this point has been normal and she has not concerns at this visit. She continues on her iron BID with docusate w/o problems. Will continue to see back in one week to perform GC/Chlamydia and GBS. Did have appointment with Dr. Debroah Loop from [facility] to set up induction for September 3rd 2014.   [name], DO of [facility] 03/12/2013, 2:42 PM

## 2013-03-21 ENCOUNTER — Ambulatory Visit (INDEPENDENT_AMBULATORY_CARE_PROVIDER_SITE_OTHER): Payer: No Typology Code available for payment source | Admitting: Family Medicine

## 2013-03-21 ENCOUNTER — Other Ambulatory Visit (HOSPITAL_COMMUNITY)
Admission: RE | Admit: 2013-03-21 | Discharge: 2013-03-21 | Disposition: A | Discharge status: Home / Self Care | Payer: No Typology Code available for payment source | Source: Ambulatory Visit | Attending: Family Medicine | Admitting: Family Medicine

## 2013-03-21 VITALS — BP 100/68 | Temp 98.3°F | Wt 186.0 lb

## 2013-03-21 DIAGNOSIS — Z3480 Encounter for supervision of other normal pregnancy, unspecified trimester: Secondary | ICD-10-CM

## 2013-03-21 DIAGNOSIS — Z113 Encounter for screening for infections with a predominantly sexual mode of transmission: Secondary | ICD-10-CM | POA: Insufficient documentation

## 2013-03-21 DIAGNOSIS — Z3483 Encounter for supervision of other normal pregnancy, third trimester: Secondary | ICD-10-CM

## 2013-03-21 NOTE — Patient Instructions (Signed)
Evaluacin de los movimientos fetales  (Fetal Movement Counts) Nombre del paciente: __________________________________________________ Fecha de parto estimada: ____________________ La evaluacin de los movimientos fetales es muy recomendable en los embarazos de alto riesgo, pero tambin es una buena idea que lo hagan todas las embarazadas. El mdico le indicar que comience a contarlos a las 28 semanas de embarazo. Los movimientos fetales suelen aumentar:   Despus de una comida completa.  Despus de la actividad fsica.  Despus de comer o beber algo dulce o fro.  En reposo. Preste atencin cuando sienta que el beb est ms activo. Esto le ayudar a notar un patrn de ciclos de vigilia y sueo de su beb y cules son los factores que contribuyen a un aumento de los movimientos fetales. Es importante llevar a cabo un recuento de movimientos fetales, al mismo tiempo cada da, cuando el beb normalmente est ms activo.  CMO CONTAR LOS MOVIMIENTOS FETALES 1. Busque un lugar tranquilo y cmodo para sentarse o recostarse sobre el lado izquierdo. Al recostarse sobre su lado izquierdo, le proporciona una mejor circulacin de sangre y oxgeno al beb. 2. Anote el da y la hora en una hoja de papel o en un diario. 3. Comience contando las pataditas, revoloteos, chasquidos, vueltas o pinchazos en un perodo de 2 horas. Debe sentir al menos 10 movimientos en 2 horas. 4. Si no siente 10 movimientos en 2 horas, espere 2  3 horas y cuente de nuevo. Busque cambios en el patrn o si no cuenta lo suficiente en 2 horas. SOLICITE ATENCIN MDICA SI:   Siente menos de 10 pataditas en 2 horas, en dos intentos.  No hay movimientos durante una hora.  El patrn se modifica o le lleva ms tiempo cada da contar las 10 pataditas.  Siente que el beb no se mueve como lo hace habitualmente. Fecha: ____________ Movimientos: ____________ Hora de inicio: ____________ Hora de finalizacin: ____________  Fecha:  ____________ Movimientos: ____________ Hora de inicio: ____________ Hora de finalizacin: ____________  Fecha: ____________ Movimientos: ____________ Hora de inicio: ____________ Hora de finalizacin: ____________  Fecha: ____________ Movimientos: ____________ Hora de inicio: ____________ Hora de finalizacin: ____________  Fecha: ____________ Movimientos: ____________ Hora de inicio: ____________ Hora de finalizacin: ____________  Fecha: ____________ Movimientos: ____________ Hora de inicio: ____________ Hora de finalizacin: ____________  Fecha: ____________ Movimientos: ____________ Hora de inicio: ____________ Hora de finalizacin: ____________  Fecha: ____________ Movimientos: ____________ Hora de inicio: ____________ Hora de finalizacin: ____________  Fecha: ____________ Movimientos: ____________ Hora de inicio: ____________ Hora de finalizacin: ____________  Fecha: ____________ Movimientos: ____________ Hora de inicio: ____________ Hora de finalizacin: ____________  Fecha: ____________ Movimientos: ____________ Hora de inicio: ____________ Hora de finalizacin: ____________  Fecha: ____________ Movimientos: ____________ Hora de inicio: ____________ Hora de finalizacin: ____________  Fecha: ____________ Movimientos: ____________ Hora de inicio: ____________ Hora de finalizacin: ____________  Fecha: ____________ Movimientos: ____________ Hora de inicio: ____________ Hora de finalizacin: ____________  Fecha: ____________ Movimientos: ____________ Hora de inicio: ____________ Hora de finalizacin: ____________  Fecha: ____________ Movimientos: ____________ Hora de inicio: ____________ Hora de finalizacin: ____________  Fecha: ____________ Movimientos: ____________ Hora de inicio: ____________ Hora de finalizacin: ____________  Fecha: ____________ Movimientos: ____________ Hora de inicio: ____________ Hora de finalizacin: ____________  Fecha: ____________ Movimientos: ____________ Hora  de inicio: ____________ Hora de finalizacin: ____________  Fecha: ____________ Movimientos: ____________ Hora de inicio: ____________ Hora de finalizacin: ____________  Fecha: ____________ Movimientos: ____________ Hora de inicio: ____________ Hora de finalizacin: ____________  Fecha: ____________ Movimientos: ____________ Hora de inicio: ____________ Hora de   finalizacin: ____________  Fecha: ____________ Movimientos: ____________ Hora de inicio: ____________ Hora de finalizacin: ____________  Fecha: ____________ Movimientos: ____________ Hora de inicio: ____________ Hora de finalizacin: ____________  Fecha: ____________ Movimientos: ____________ Hora de inicio: ____________ Hora de finalizacin: ____________  Fecha: ____________ Movimientos: ____________ Hora de inicio: ____________ Hora de finalizacin: ____________  Fecha: ____________ Movimientos: ____________ Hora de inicio: ____________ Hora de finalizacin: ____________  Fecha: ____________ Movimientos: ____________ Hora de inicio: ____________ Hora de finalizacin: ____________  Fecha: ____________ Movimientos: ____________ Hora de inicio: ____________ Hora de finalizacin: ____________  Fecha: ____________ Movimientos: ____________ Hora de inicio: ____________ Hora de finalizacin: ____________  Fecha: ____________ Movimientos: ____________ Hora de inicio: ____________ Hora de finalizacin: ____________  Fecha: ____________ Movimientos: ____________ Hora de inicio: ____________ Hora de finalizacin: ____________  Fecha: ____________ Movimientos: ____________ Hora de inicio: ____________ Hora de finalizacin: ____________  Fecha: ____________ Movimientos: ____________ Hora de inicio: ____________ Hora de finalizacin: ____________  Fecha: ____________ Movimientos: ____________ Hora de inicio: ____________ Hora de finalizacin: ____________  Fecha: ____________ Movimientos: ____________ Hora de inicio: ____________ Hora de finalizacin:  ____________  Fecha: ____________ Movimientos: ____________ Hora de inicio: ____________ Hora de finalizacin: ____________  Fecha: ____________ Movimientos: ____________ Hora de inicio: ____________ Hora de finalizacin: ____________  Fecha: ____________ Movimientos: ____________ Hora de inicio: ____________ Hora de finalizacin: ____________  Fecha: ____________ Movimientos: ____________ Hora de inicio: ____________ Hora de finalizacin: ____________  Fecha: ____________ Movimientos: ____________ Hora de inicio: ____________ Hora de finalizacin: ____________  Fecha: ____________ Movimientos: ____________ Hora de inicio: ____________ Hora de finalizacin: ____________  Fecha: ____________ Movimientos: ____________ Hora de inicio: ____________ Hora de finalizacin: ____________  Fecha: ____________ Movimientos: ____________ Hora de inicio: ____________ Hora de finalizacin: ____________  Fecha: ____________ Movimientos: ____________ Hora de inicio: ____________ Hora de finalizacin: ____________  Fecha: ____________ Movimientos: ____________ Hora de inicio: ____________ Hora de finalizacin: ____________  Fecha: ____________ Movimientos: ____________ Hora de inicio: ____________ Hora de finalizacin: ____________  Fecha: ____________ Movimientos: ____________ Hora de inicio: ____________ Hora de finalizacin: ____________  Fecha: ____________ Movimientos: ____________ Hora de inicio: ____________ Hora de finalizacin: ____________  Fecha: ____________ Movimientos: ____________ Hora de inicio: ____________ Hora de finalizacin: ____________  Fecha: ____________ Movimientos: ____________ Hora de inicio: ____________ Hora de finalizacin: ____________  Fecha: ____________ Movimientos: ____________ Hora de inicio: ____________ Hora de finalizacin: ____________  Fecha: ____________ Movimientos: ____________ Hora de inicio: ____________ Hora de finalizacin: ____________  Fecha: ____________  Movimientos: ____________ Hora de inicio: ____________ Hora de finalizacin: ____________  Fecha: ____________ Movimientos: ____________ Hora de inicio: ____________ Hora de finalizacin: ____________  Fecha: ____________ Movimientos: ____________ Hora de inicio: ____________ Hora de finalizacin: ____________  Document Released: 10/31/2007 Document Revised: 07/10/2012 ExitCare Patient Information 2014 [employer].  

## 2013-03-21 NOTE — Progress Notes (Signed)
[name] is a [id number] @ 36.2 wk best dated by [place] presenting for f/u OB visit. Since last visit, she is doing well without vaginal d/c, vaginal bleeding, increased contractions, abdominal pain, diplopia, blurred vision, worsening leg edema, or HA.   Exam:  Filed Vitals:   03/21/13 1109  BP: 100/68  Temp: 98.3 F (36.8 C)    RRR, no murmur appreciated  No thyroid megaly  NABS, no TTP  Fetal HR 140 Trace pedal edema B/L   A/P  G3P2002 at 36.2 wks w/o complaints today. Labwork to this point has been normal and she has not concerns at this visit. She continues on her iron BID with docusate w/o problems. Will continue to see back in one week.  Did perform GC/Chlamydia and GBS today. Did have appointment with Dr. Debroah Loop from [facility] to set up induction for September 3rd 2014.   [name], DO of [facility] 03/21/2013, 11:31 AM

## 2013-03-28 ENCOUNTER — Ambulatory Visit (INDEPENDENT_AMBULATORY_CARE_PROVIDER_SITE_OTHER): Payer: No Typology Code available for payment source | Admitting: Family Medicine

## 2013-03-28 VITALS — BP 108/71 | Wt 188.0 lb

## 2013-03-28 DIAGNOSIS — Z3483 Encounter for supervision of other normal pregnancy, third trimester: Secondary | ICD-10-CM

## 2013-03-28 DIAGNOSIS — Z3480 Encounter for supervision of other normal pregnancy, unspecified trimester: Secondary | ICD-10-CM

## 2013-03-28 NOTE — Progress Notes (Signed)
[name] is a [id number] @ 37.2 wk best dated by [place] presenting for f/u OB visit. Since last visit, she is doing well without vaginal d/c, vaginal bleeding, increased contractions, abdominal pain, diplopia, blurred vision, worsening leg edema, or HA.  She is having some trouble sleeping, but other than that, no acute concerns.  Exam:  Filed Vitals:   03/28/13 0917  BP: 108/71    RRR, no murmur appreciated  No thyroid megaly  NABS, no TTP  Fetal HR 140  Trace pedal edema B/L   A/P  G3P2002 at 37.2 wks w/o complaints today. GBS/GC/Chlamydia negative from last visit.  Recommended some benadryl PRN for sleep if she is having trouble.  We will see her back in one week and then due for C-Section on September 3rd, along with pre-admit appt with Dr. Debroah Loop on September 2nd.   [name], DO of [facility] 03/28/2013, 10:27 AM

## 2013-03-28 NOTE — Patient Instructions (Signed)
Parto por cesrea  (Cesarean Delivery) El parto por cesrea es el nacimiento de un feto a travs de un corte (incisin) en el vientre (abdomen) y en el matriz (tero).  HGALE SABER A SU MDICO SOBRE:   Complicaciones del embarazo.  Alergias.  Medicamentos que [place], incluyendo hierbas, gotas oftlmicas, medicamentos de [place] y Control and instrumentation engineer.  Uso de corticoides (por va oral o cremas).  Problemas anteriores debido a anestsicos o a medicamentos que [employer] sensibilidad.  Cirugas anteriores.  Historia de cogulos sanguneos  Problemas hemorrgicos o en la sangre.  Otros problemas de [place]. RIESGOS Y COMPLICACIONES  Hemorragias.  Infeccin.  Cogulos sanguneos.  Lesin en los rganos circundantes.  Problemas con la anestesia.  Lesiones al beb. ANTES DEL PROCEDIMIENTO   Le insertarn un tubo (catter [employer] vejiga. El catter Foley drena la orina desde la vejiga hacia [place]. Esto mantendr la vejiga vaca durante la [place].  Le colocarn un catter de acceso intravenoso (IV) en el brazo.  Luego rasurarn la zona del pubis y la parte inferior del abdomen. Esto se realiza para evitar una infeccin en el sitio de la incisin.  Le administrarn un medicamento anticido. Esto impedir que los contenidos cidos del estmago ingresen a los pulmones si vomita durante el procedimiento.  Le podrn dar antibiticos para prevenir infecciones. PROCEDIMIENTO   Le administrarn un medicamento para adormecer a zona inferior del cuerpo anestesia regional). Si estuviera en trabajo de parto, podrn aplicarle una anestesia epidural, que se utiliza tanto el el [place] de parto como en la cesrea. Puede ser [employer] administren un medicamento que la har dormir (anestesia general), aunque esto no es tan frecuente.  Le harn una incisin en el abdomen que se extiende [employer]. Hay dos tipos bsicos de incisin:  La incisin horizontal (transversa) Las incisiones  horizontales se Engineer, maintenance mayor parte de las cesreas de [place].  La incisin vertical (de Jolene Schimke). Esta es la menos frecuente. Se reserva para aquellas mujeres que tienen una complicacin grave (parto prematuro extremo) o bajo situaciones de Associate Professor.  Las incisiones horizontales y Garment/textile technologist pueden utilizarse ambas al [employer]. Sin embargo, no es Air traffic controller.  El beb nacer. DESPUS DEL PROCEDIMIENTO   Si estuvo despierta durante la [place], podr ver al beb enseguida. Si la duermen, ver al beb tan pronto como despierte.  Podr amamantar a su beb despus del procedimiento.  Podr levantarse y Therapist, art mismo da de la [place]. Si Office manager en cama durante cierto tiempo, recibir ayuda para darse vuelta, toser y Industrial/product designer profundamente despus de la ciruga. Esto ayuda a Environmental health practitioner en los pulmones, como la neumona.  No se levante de la cama sola la primera vez luego de la [place]. Necesitar ayuda para levantarse de la cama hasta que pueda hacerlo sola.  Podr darse Shaune Spittle el da siguiente a la [place]. Despus que le quiten el vendaje, un enfermero la ayudar a ducharse.  Tendr unas medias compresivas neumticas en los pies o en la zona inferior de las piernas. Se colocan para prevenir cogulos sanguneos. Cuando se levante y camine regularmente, ya no sern necesarias.  Nocruce las piernas al sentarse.  Si elimina cogulos de sangre, gurdelos. Si elimina un cogulo cuando va al bao, por favor no tire la cadena. Llame al enfermero. Comunquele al enfermero si piensa que tiene demasiada hemorragia o que elimina muchos cogulos.  Comience a beber lquidos y a alimentarse segn las indicaciones del mdico. Si el  estmago no est en condiciones,comer y beber demasiado pronto puede causar aumento de la hinchazn y la inflamacin del intestino o el abdomen. Esto es [employer].  Le darn medicamentos si los necesita. Dgale a los  profesionales si siente dolor. Ellos tratarn de que se sienta cmoda. Tambin le indicarn antibiticos para prevenir una infeccin.  Le quitarn la va intravenosa cuando beba una cantidad razonable de lquido. El catter Foley se retirar cuando se levante y camine.  Si su tipo sanguneo es Rh negativo y el beb es Rh positivo, le darn una inyeccin de inmunoblobulina anti D. Esta inyeccin evita que tenga problemas con el Rh en embarazos futuros. Deber colocarse la inyeccin an si se ha Production assistant, radio las trompas.  Si le permiten llevar al beb a dar un paseo,colquelo en la cunita y empjela. No lleve al beb en sus brazos. Document Released: 07/24/2005 Document Revised: 10/16/2011 [facility] Patient Information 2014 [place], [place].

## 2013-04-04 ENCOUNTER — Encounter (HOSPITAL_COMMUNITY): Payer: Self-pay

## 2013-04-04 ENCOUNTER — Ambulatory Visit (INDEPENDENT_AMBULATORY_CARE_PROVIDER_SITE_OTHER): Payer: No Typology Code available for payment source | Admitting: Family Medicine

## 2013-04-04 VITALS — BP 111/62 | Temp 99.0°F | Wt 190.6 lb

## 2013-04-04 DIAGNOSIS — Z3483 Encounter for supervision of other normal pregnancy, third trimester: Secondary | ICD-10-CM

## 2013-04-04 DIAGNOSIS — Z3480 Encounter for supervision of other normal pregnancy, unspecified trimester: Secondary | ICD-10-CM

## 2013-04-04 NOTE — Progress Notes (Signed)
[name] is a [id number] @ 38.2 wk best dated by [place] presenting for f/u OB visit. Since last visit, she is doing well without vaginal d/c, vaginal bleeding, increased contractions, abdominal pain, diplopia, blurred vision, worsening leg edema, or HA. She is having some trouble sleeping, but other than that, no acute concerns.  Exam:  Filed Vitals:   04/04/13 0919  BP: 111/62  Temp: 99 F (37.2 C)    RRR, no murmur appreciated  No thyroid megaly  NABS, no TTP  Fetal HR 143  Trace pedal edema B/L   A/P  G3P2002 at 38.2 wks w/o complaints today. No concerns at this point in her pregnancy and schedule for pre-C-Section appt on 04/08/13 and then C-Section on 04/09/13.  Will scrub in for the C-Section as well as follow along mother and baby girl "samantha".  [name], DO of [facility] 04/04/2013, 9:39 AM

## 2013-04-04 NOTE — Patient Instructions (Signed)
Evaluacin de los movimientos fetales  (Fetal Movement Counts) Nombre del paciente: __________________________________________________ Fecha de parto estimada: ____________________ La evaluacin de los movimientos fetales es muy recomendable en los embarazos de alto riesgo, pero tambin es una buena idea que lo hagan todas las embarazadas. El mdico le indicar que comience a contarlos a las 28 semanas de embarazo. Los movimientos fetales suelen aumentar:   Despus de una comida completa.  Despus de la actividad fsica.  Despus de comer o beber algo dulce o fro.  En reposo. Preste atencin cuando sienta que el beb est ms activo. Esto le ayudar a notar un patrn de ciclos de vigilia y sueo de su beb y cules son los factores que contribuyen a un aumento de los movimientos fetales. Es importante llevar a cabo un recuento de movimientos fetales, al mismo tiempo cada da, cuando el beb normalmente est ms activo.  CMO CONTAR LOS MOVIMIENTOS FETALES 1. Busque un lugar tranquilo y cmodo para sentarse o recostarse sobre el lado izquierdo. Al recostarse sobre su lado izquierdo, le proporciona una mejor circulacin de sangre y oxgeno al beb. 2. Anote el da y la hora en una hoja de papel o en un diario. 3. Comience contando las pataditas, revoloteos, chasquidos, vueltas o pinchazos en un perodo de 2 horas. Debe sentir al menos 10 movimientos en 2 horas. 4. Si no siente 10 movimientos en 2 horas, espere 2  3 horas y cuente de nuevo. Busque cambios en el patrn o si no cuenta lo suficiente en 2 horas. SOLICITE ATENCIN MDICA SI:   Siente menos de 10 pataditas en 2 horas, en dos intentos.  No hay movimientos durante una hora.  El patrn se modifica o le lleva ms tiempo cada da contar las 10 pataditas.  Siente que el beb no se mueve como lo hace habitualmente. Fecha: ____________ Movimientos: ____________ Hora de inicio: ____________ Hora de finalizacin: ____________  Fecha:  ____________ Movimientos: ____________ Hora de inicio: ____________ Hora de finalizacin: ____________  Fecha: ____________ Movimientos: ____________ Hora de inicio: ____________ Hora de finalizacin: ____________  Fecha: ____________ Movimientos: ____________ Hora de inicio: ____________ Hora de finalizacin: ____________  Fecha: ____________ Movimientos: ____________ Hora de inicio: ____________ Hora de finalizacin: ____________  Fecha: ____________ Movimientos: ____________ Hora de inicio: ____________ Hora de finalizacin: ____________  Fecha: ____________ Movimientos: ____________ Hora de inicio: ____________ Hora de finalizacin: ____________  Fecha: ____________ Movimientos: ____________ Hora de inicio: ____________ Hora de finalizacin: ____________  Fecha: ____________ Movimientos: ____________ Hora de inicio: ____________ Hora de finalizacin: ____________  Fecha: ____________ Movimientos: ____________ Hora de inicio: ____________ Hora de finalizacin: ____________  Fecha: ____________ Movimientos: ____________ Hora de inicio: ____________ Hora de finalizacin: ____________  Fecha: ____________ Movimientos: ____________ Hora de inicio: ____________ Hora de finalizacin: ____________  Fecha: ____________ Movimientos: ____________ Hora de inicio: ____________ Hora de finalizacin: ____________  Fecha: ____________ Movimientos: ____________ Hora de inicio: ____________ Hora de finalizacin: ____________  Fecha: ____________ Movimientos: ____________ Hora de inicio: ____________ Hora de finalizacin: ____________  Fecha: ____________ Movimientos: ____________ Hora de inicio: ____________ Hora de finalizacin: ____________  Fecha: ____________ Movimientos: ____________ Hora de inicio: ____________ Hora de finalizacin: ____________  Fecha: ____________ Movimientos: ____________ Hora de inicio: ____________ Hora de finalizacin: ____________  Fecha: ____________ Movimientos: ____________ Hora  de inicio: ____________ Hora de finalizacin: ____________  Fecha: ____________ Movimientos: ____________ Hora de inicio: ____________ Hora de finalizacin: ____________  Fecha: ____________ Movimientos: ____________ Hora de inicio: ____________ Hora de finalizacin: ____________  Fecha: ____________ Movimientos: ____________ Hora de inicio: ____________ Hora de   finalizacin: ____________  Fecha: ____________ Movimientos: ____________ Hora de inicio: ____________ Hora de finalizacin: ____________  Fecha: ____________ Movimientos: ____________ Hora de inicio: ____________ Hora de finalizacin: ____________  Fecha: ____________ Movimientos: ____________ Hora de inicio: ____________ Hora de finalizacin: ____________  Fecha: ____________ Movimientos: ____________ Hora de inicio: ____________ Hora de finalizacin: ____________  Fecha: ____________ Movimientos: ____________ Hora de inicio: ____________ Hora de finalizacin: ____________  Fecha: ____________ Movimientos: ____________ Hora de inicio: ____________ Hora de finalizacin: ____________  Fecha: ____________ Movimientos: ____________ Hora de inicio: ____________ Hora de finalizacin: ____________  Fecha: ____________ Movimientos: ____________ Hora de inicio: ____________ Hora de finalizacin: ____________  Fecha: ____________ Movimientos: ____________ Hora de inicio: ____________ Hora de finalizacin: ____________  Fecha: ____________ Movimientos: ____________ Hora de inicio: ____________ Hora de finalizacin: ____________  Fecha: ____________ Movimientos: ____________ Hora de inicio: ____________ Hora de finalizacin: ____________  Fecha: ____________ Movimientos: ____________ Hora de inicio: ____________ Hora de finalizacin: ____________  Fecha: ____________ Movimientos: ____________ Hora de inicio: ____________ Hora de finalizacin: ____________  Fecha: ____________ Movimientos: ____________ Hora de inicio: ____________ Hora de finalizacin:  ____________  Fecha: ____________ Movimientos: ____________ Hora de inicio: ____________ Hora de finalizacin: ____________  Fecha: ____________ Movimientos: ____________ Hora de inicio: ____________ Hora de finalizacin: ____________  Fecha: ____________ Movimientos: ____________ Hora de inicio: ____________ Hora de finalizacin: ____________  Fecha: ____________ Movimientos: ____________ Hora de inicio: ____________ Hora de finalizacin: ____________  Fecha: ____________ Movimientos: ____________ Hora de inicio: ____________ Hora de finalizacin: ____________  Fecha: ____________ Movimientos: ____________ Hora de inicio: ____________ Hora de finalizacin: ____________  Fecha: ____________ Movimientos: ____________ Hora de inicio: ____________ Hora de finalizacin: ____________  Fecha: ____________ Movimientos: ____________ Hora de inicio: ____________ Hora de finalizacin: ____________  Fecha: ____________ Movimientos: ____________ Hora de inicio: ____________ Hora de finalizacin: ____________  Fecha: ____________ Movimientos: ____________ Hora de inicio: ____________ Hora de finalizacin: ____________  Fecha: ____________ Movimientos: ____________ Hora de inicio: ____________ Hora de finalizacin: ____________  Fecha: ____________ Movimientos: ____________ Hora de inicio: ____________ Hora de finalizacin: ____________  Fecha: ____________ Movimientos: ____________ Hora de inicio: ____________ Hora de finalizacin: ____________  Fecha: ____________ Movimientos: ____________ Hora de inicio: ____________ Hora de finalizacin: ____________  Fecha: ____________ Movimientos: ____________ Hora de inicio: ____________ Hora de finalizacin: ____________  Fecha: ____________ Movimientos: ____________ Hora de inicio: ____________ Hora de finalizacin: ____________  Fecha: ____________ Movimientos: ____________ Hora de inicio: ____________ Hora de finalizacin: ____________  Fecha: ____________  Movimientos: ____________ Hora de inicio: ____________ Hora de finalizacin: ____________  Fecha: ____________ Movimientos: ____________ Hora de inicio: ____________ Hora de finalizacin: ____________  Fecha: ____________ Movimientos: ____________ Hora de inicio: ____________ Hora de finalizacin: ____________  Document Released: 10/31/2007 Document Revised: 07/10/2012 ExitCare Patient Information 2014 [employer].  

## 2013-04-08 ENCOUNTER — Encounter (HOSPITAL_COMMUNITY)
Admission: RE | Admit: 2013-04-08 | Discharge: 2013-04-08 | Disposition: A | Discharge status: Home / Self Care | Payer: No Typology Code available for payment source | Source: Ambulatory Visit | Attending: Family Medicine | Admitting: Family Medicine

## 2013-04-08 ENCOUNTER — Encounter (HOSPITAL_COMMUNITY): Payer: Self-pay

## 2013-04-08 DIAGNOSIS — Z01812 Encounter for preprocedural laboratory examination: Secondary | ICD-10-CM | POA: Insufficient documentation

## 2013-04-08 DIAGNOSIS — Z01818 Encounter for other preprocedural examination: Secondary | ICD-10-CM | POA: Insufficient documentation

## 2013-04-08 LAB — CBC
HCT: 35.9 % — ABNORMAL LOW (ref 36.0–46.0)
Hemoglobin: 11.8 g/dL — ABNORMAL LOW (ref 12.0–15.0)
MCH: 30.3 pg (ref 26.0–34.0)
MCHC: 32.9 g/dL (ref 30.0–36.0)
RDW: 14.4 % (ref 11.5–15.5)

## 2013-04-08 NOTE — Pre-Procedure Instructions (Signed)
Preoperative instructions reviewed with pt with interpretor Eda present. Will need and interpretor day of surgery

## 2013-04-08 NOTE — Patient Instructions (Signed)
21 [name]  04/08/2013   Your procedure is scheduled on:  April 09, 2013  Enter through the [employer] of [facility] at 0800 AM.  Pick up the phone at the desk and dial [phone].   Call this number if you have problems the morning of surgery: [phone]   Remember:   Do not eat food:After Midnight.  Do not drink clear liquids: After Midnight.  Take these medicines the morning of surgery with A SIP OF WATER: no medications   Do not wear jewelry, make-up or nail polish.  Do not wear lotions, powders, or perfumes. You may wear deodorant.  Do not shave 48 hours prior to surgery.  Do not bring valuables to the hospital.  [facility] is not responsible for any belongings or valuables brought to the hospital.  Contacts, dentures or bridgework may not be worn into surgery.  Leave suitcase in the car. After surgery it may be brought to your room.  For patients admitted to the hospital, checkout time is 11:00 AM the day of discharge.   Patients discharged the day of surgery will not be allowed to drive home.  Name and phone number of your driver: Jerrel Ivory   Special Instructions: Shower using CHG 2 nights before surgery and the night before surgery.  If you shower the day of surgery use CHG.  Use special wash - you have one bottle of CHG for all showers.  You should use approximately 1/3 of the bottle for each shower.

## 2013-04-09 ENCOUNTER — Inpatient Hospital Stay (HOSPITAL_COMMUNITY)
Admission: AD | Admit: 2013-04-09 | Discharge: 2013-04-11 | DRG: 766 | Disposition: A | Discharge status: Home / Self Care | Payer: Medicaid Other | Source: Ambulatory Visit | Attending: Family Medicine | Admitting: Family Medicine

## 2013-04-09 ENCOUNTER — Encounter (HOSPITAL_COMMUNITY)
Admission: AD | Disposition: A | Discharge status: Home / Self Care | Payer: Self-pay | Source: Ambulatory Visit | Attending: Family Medicine

## 2013-04-09 ENCOUNTER — Encounter (HOSPITAL_COMMUNITY): Payer: Self-pay | Admitting: Anesthesiology

## 2013-04-09 ENCOUNTER — Inpatient Hospital Stay (HOSPITAL_COMMUNITY): Payer: Medicaid Other | Admitting: Anesthesiology

## 2013-04-09 ENCOUNTER — Encounter (HOSPITAL_COMMUNITY): Payer: Self-pay | Admitting: Family Medicine

## 2013-04-09 DIAGNOSIS — Z302 Encounter for sterilization: Secondary | ICD-10-CM

## 2013-04-09 DIAGNOSIS — D509 Iron deficiency anemia, unspecified: Secondary | ICD-10-CM

## 2013-04-09 DIAGNOSIS — O34219 Maternal care for unspecified type scar from previous cesarean delivery: Secondary | ICD-10-CM

## 2013-04-09 DIAGNOSIS — O9902 Anemia complicating childbirth: Secondary | ICD-10-CM | POA: Diagnosis present

## 2013-04-09 DIAGNOSIS — Z3483 Encounter for supervision of other normal pregnancy, third trimester: Secondary | ICD-10-CM

## 2013-04-09 SURGERY — Surgical Case
Anesthesia: Spinal | Site: Abdomen | Laterality: Bilateral | Wound class: Clean Contaminated

## 2013-04-09 MED ORDER — TETANUS-DIPHTH-ACELL PERTUSSIS 5-2.5-18.5 LF-MCG/0.5 IM SUSP
0.5000 mL | Freq: Once | INTRAMUSCULAR | Status: AC
  Administered 2013-04-10: 0.5 mL via INTRAMUSCULAR
  Filled 2013-04-09: qty 0.5

## 2013-04-09 MED ORDER — METHYLERGONOVINE MALEATE 0.2 MG/ML IJ SOLN
0.2000 mg | Freq: Once | INTRAMUSCULAR | Status: AC
  Administered 2013-04-09: 0.2 mg via INTRAMUSCULAR

## 2013-04-09 MED ORDER — LACTATED RINGERS IV SOLN
INTRAVENOUS | Status: DC
  Administered 2013-04-09 (×2): via INTRAVENOUS

## 2013-04-09 MED ORDER — MORPHINE SULFATE (PF) 0.5 MG/ML IJ SOLN
INTRAMUSCULAR | Status: DC | PRN
  Administered 2013-04-09: .15 mg via INTRATHECAL

## 2013-04-09 MED ORDER — MEPERIDINE HCL 25 MG/ML IJ SOLN: 6.2500 mg | INTRAMUSCULAR | Status: DC | PRN

## 2013-04-09 MED ORDER — NALBUPHINE SYRINGE 5 MG/0.5 ML
5.0000 mg | INJECTION | INTRAMUSCULAR | Status: DC | PRN
Start: 2013-04-09 — End: 2013-04-11
  Filled 2013-04-09: qty 1

## 2013-04-09 MED ORDER — SCOPOLAMINE 1 MG/3DAYS TD PT72
1.0000 | MEDICATED_PATCH | TRANSDERMAL | Status: DC
  Administered 2013-04-09: 1.5 mg via TRANSDERMAL

## 2013-04-09 MED ORDER — OXYTOCIN 10 UNIT/ML IJ SOLN
INTRAMUSCULAR | Status: AC
  Filled 2013-04-09: qty 4

## 2013-04-09 MED ORDER — CEFAZOLIN SODIUM-DEXTROSE 2-3 GM-% IV SOLR
INTRAVENOUS | Status: AC
  Filled 2013-04-09: qty 50

## 2013-04-09 MED ORDER — KETOROLAC TROMETHAMINE 30 MG/ML IJ SOLN
30.0000 mg | Freq: Four times a day (QID) | INTRAMUSCULAR | Status: AC | PRN
  Administered 2013-04-09: 30 mg via INTRAVENOUS
  Filled 2013-04-09: qty 1

## 2013-04-09 MED ORDER — KETOROLAC TROMETHAMINE 60 MG/2ML IM SOLN
60.0000 mg | Freq: Once | INTRAMUSCULAR | Status: AC | PRN
  Filled 2013-04-09: qty 2

## 2013-04-09 MED ORDER — ONDANSETRON HCL 4 MG/2ML IJ SOLN: 4.0000 mg | INTRAMUSCULAR | Status: DC | PRN

## 2013-04-09 MED ORDER — SIMETHICONE 80 MG PO CHEW: 80.0000 mg | CHEWABLE_TABLET | ORAL | Status: DC | PRN

## 2013-04-09 MED ORDER — OXYTOCIN 10 UNIT/ML IJ SOLN
40.0000 [IU] | INTRAVENOUS | Status: DC | PRN
  Administered 2013-04-09: 40 [IU] via INTRAVENOUS

## 2013-04-09 MED ORDER — IBUPROFEN 600 MG PO TABS
600.0000 mg | ORAL_TABLET | Freq: Four times a day (QID) | ORAL | Status: DC
  Administered 2013-04-10 – 2013-04-11 (×7): 600 mg via ORAL
  Filled 2013-04-09 (×7): qty 1

## 2013-04-09 MED ORDER — DIPHENHYDRAMINE HCL 25 MG PO CAPS
25.0000 mg | ORAL_CAPSULE | ORAL | Status: DC | PRN
  Administered 2013-04-09: 25 mg via ORAL

## 2013-04-09 MED ORDER — WITCH HAZEL-GLYCERIN EX PADS: 1.0000 "application " | MEDICATED_PAD | CUTANEOUS | Status: DC | PRN

## 2013-04-09 MED ORDER — DIPHENHYDRAMINE HCL 25 MG PO CAPS
25.0000 mg | ORAL_CAPSULE | Freq: Four times a day (QID) | ORAL | Status: DC | PRN
  Filled 2013-04-09: qty 1

## 2013-04-09 MED ORDER — FENTANYL CITRATE 0.05 MG/ML IJ SOLN
INTRAMUSCULAR | Status: DC | PRN
  Administered 2013-04-09: 25 ug via INTRATHECAL

## 2013-04-09 MED ORDER — SODIUM CHLORIDE 0.9 % IJ SOLN: 3.0000 mL | INTRAMUSCULAR | Status: DC | PRN

## 2013-04-09 MED ORDER — ONDANSETRON HCL 4 MG PO TABS: 4.0000 mg | ORAL_TABLET | ORAL | Status: DC | PRN

## 2013-04-09 MED ORDER — DIPHENHYDRAMINE HCL 50 MG/ML IJ SOLN: 12.5000 mg | INTRAMUSCULAR | Status: DC | PRN

## 2013-04-09 MED ORDER — PHENYLEPHRINE HCL 10 MG/ML IJ SOLN
INTRAMUSCULAR | Status: DC | PRN
  Administered 2013-04-09 (×8): 40 ug via INTRAVENOUS

## 2013-04-09 MED ORDER — HYDROMORPHONE HCL PF 1 MG/ML IJ SOLN: 0.2500 mg | INTRAMUSCULAR | Status: DC | PRN

## 2013-04-09 MED ORDER — METHYLERGONOVINE MALEATE 0.2 MG/ML IJ SOLN
INTRAMUSCULAR | Status: AC
  Administered 2013-04-09: 0.2 mg via INTRAMUSCULAR
  Filled 2013-04-09: qty 1

## 2013-04-09 MED ORDER — MORPHINE SULFATE 0.5 MG/ML IJ SOLN
INTRAMUSCULAR | Status: AC
  Filled 2013-04-09: qty 10

## 2013-04-09 MED ORDER — SENNOSIDES-DOCUSATE SODIUM 8.6-50 MG PO TABS
2.0000 | ORAL_TABLET | Freq: Every day | ORAL | Status: DC
  Administered 2013-04-09 – 2013-04-10 (×2): 2 via ORAL

## 2013-04-09 MED ORDER — ONDANSETRON HCL 4 MG/2ML IJ SOLN: 4.0000 mg | Freq: Three times a day (TID) | INTRAMUSCULAR | Status: DC | PRN

## 2013-04-09 MED ORDER — MENTHOL 3 MG MT LOZG: 1.0000 | LOZENGE | OROMUCOSAL | Status: DC | PRN

## 2013-04-09 MED ORDER — OXYTOCIN 40 UNITS IN LACTATED RINGERS INFUSION - SIMPLE MED: 62.5000 mL/h | INTRAVENOUS | Status: AC

## 2013-04-09 MED ORDER — ONDANSETRON HCL 4 MG/2ML IJ SOLN
INTRAMUSCULAR | Status: DC | PRN
  Administered 2013-04-09: 4 mg via INTRAVENOUS

## 2013-04-09 MED ORDER — NALOXONE HCL 0.4 MG/ML IJ SOLN: 0.4000 mg | INTRAMUSCULAR | Status: DC | PRN

## 2013-04-09 MED ORDER — CEFAZOLIN SODIUM-DEXTROSE 2-3 GM-% IV SOLR
2.0000 g | INTRAVENOUS | Status: AC
  Administered 2013-04-09: 2 g via INTRAVENOUS

## 2013-04-09 MED ORDER — BUPIVACAINE HCL (PF) 0.25 % IJ SOLN
INTRAMUSCULAR | Status: AC
  Filled 2013-04-09: qty 30

## 2013-04-09 MED ORDER — DEXTROSE IN LACTATED RINGERS 5 % IV SOLN: INTRAVENOUS | Status: DC

## 2013-04-09 MED ORDER — NALOXONE HCL 1 MG/ML IJ SOLN
1.0000 ug/kg/h | INTRAVENOUS | Status: DC | PRN
  Filled 2013-04-09: qty 2

## 2013-04-09 MED ORDER — NALBUPHINE SYRINGE 5 MG/0.5 ML
5.0000 mg | INJECTION | INTRAMUSCULAR | Status: DC | PRN
  Filled 2013-04-09 (×2): qty 1

## 2013-04-09 MED ORDER — DIPHENHYDRAMINE HCL 50 MG/ML IJ SOLN: 25.0000 mg | INTRAMUSCULAR | Status: DC | PRN

## 2013-04-09 MED ORDER — FENTANYL CITRATE 0.05 MG/ML IJ SOLN
INTRAMUSCULAR | Status: AC
  Filled 2013-04-09: qty 2

## 2013-04-09 MED ORDER — SCOPOLAMINE 1 MG/3DAYS TD PT72
MEDICATED_PATCH | TRANSDERMAL | Status: AC
  Administered 2013-04-09: 1.5 mg via TRANSDERMAL
  Filled 2013-04-09: qty 1

## 2013-04-09 MED ORDER — PHENYLEPHRINE 40 MCG/ML (10ML) SYRINGE FOR IV PUSH (FOR BLOOD PRESSURE SUPPORT)
PREFILLED_SYRINGE | INTRAVENOUS | Status: AC
  Filled 2013-04-09: qty 10

## 2013-04-09 MED ORDER — LANOLIN HYDROUS EX OINT: 1.0000 "application " | TOPICAL_OINTMENT | CUTANEOUS | Status: DC | PRN

## 2013-04-09 MED ORDER — EPHEDRINE SULFATE 50 MG/ML IJ SOLN
INTRAMUSCULAR | Status: DC | PRN
  Administered 2013-04-09 (×2): 5 mg via INTRAVENOUS
  Administered 2013-04-09 (×4): 10 mg via INTRAVENOUS

## 2013-04-09 MED ORDER — OXYCODONE-ACETAMINOPHEN 5-325 MG PO TABS
1.0000 | ORAL_TABLET | ORAL | Status: DC | PRN
  Administered 2013-04-10 (×2): 2 via ORAL
  Administered 2013-04-11: 1 via ORAL
  Filled 2013-04-09 (×2): qty 2
  Filled 2013-04-09: qty 1

## 2013-04-09 MED ORDER — SIMETHICONE 80 MG PO CHEW
80.0000 mg | CHEWABLE_TABLET | Freq: Three times a day (TID) | ORAL | Status: DC
  Administered 2013-04-09 – 2013-04-10 (×6): 80 mg via ORAL

## 2013-04-09 MED ORDER — PRENATAL MULTIVITAMIN CH
1.0000 | ORAL_TABLET | Freq: Every day | ORAL | Status: DC
  Administered 2013-04-10 – 2013-04-11 (×2): 1 via ORAL
  Filled 2013-04-09 (×2): qty 1

## 2013-04-09 MED ORDER — BUPIVACAINE HCL (PF) 0.25 % IJ SOLN
INTRAMUSCULAR | Status: DC | PRN
  Administered 2013-04-09: 30 mL

## 2013-04-09 MED ORDER — EPHEDRINE 5 MG/ML INJ
INTRAVENOUS | Status: AC
  Filled 2013-04-09: qty 10

## 2013-04-09 MED ORDER — METOCLOPRAMIDE HCL 5 MG/ML IJ SOLN: 10.0000 mg | Freq: Three times a day (TID) | INTRAMUSCULAR | Status: DC | PRN

## 2013-04-09 MED ORDER — ZOLPIDEM TARTRATE 5 MG PO TABS: 5.0000 mg | ORAL_TABLET | Freq: Every evening | ORAL | Status: DC | PRN

## 2013-04-09 MED ORDER — OXYMETAZOLINE HCL 0.05 % NA SOLN
1.0000 | Freq: Two times a day (BID) | NASAL | Status: DC
  Administered 2013-04-09 – 2013-04-10 (×2): 1 via NASAL

## 2013-04-09 MED ORDER — KETOROLAC TROMETHAMINE 30 MG/ML IJ SOLN: 30.0000 mg | Freq: Four times a day (QID) | INTRAMUSCULAR | Status: AC | PRN

## 2013-04-09 MED ORDER — LACTATED RINGERS IV SOLN
INTRAVENOUS | Status: DC
  Administered 2013-04-09 (×2): via INTRAVENOUS

## 2013-04-09 MED ORDER — MEASLES, MUMPS & RUBELLA VAC ~~LOC~~ INJ
0.5000 mL | INJECTION | Freq: Once | SUBCUTANEOUS | Status: DC
  Filled 2013-04-09: qty 0.5

## 2013-04-09 MED ORDER — SCOPOLAMINE 1 MG/3DAYS TD PT72
1.0000 | MEDICATED_PATCH | Freq: Once | TRANSDERMAL | Status: DC
  Filled 2013-04-09: qty 1

## 2013-04-09 MED ORDER — DIBUCAINE 1 % RE OINT: 1.0000 "application " | TOPICAL_OINTMENT | RECTAL | Status: DC | PRN

## 2013-04-09 MED ORDER — ONDANSETRON HCL 4 MG/2ML IJ SOLN
INTRAMUSCULAR | Status: AC
  Filled 2013-04-09: qty 2

## 2013-04-09 SURGICAL SUPPLY — 36 items
APL SKNCLS STERI-STRIP NONHPOA (GAUZE/BANDAGES/DRESSINGS) ×1
BENZOIN TINCTURE PRP APPL 2/3 (GAUZE/BANDAGES/DRESSINGS) ×1 IMPLANT
BINDER ABD UNIV 10 28-50 (GAUZE/BANDAGES/DRESSINGS) IMPLANT
BINDER ABDOM UNIV 10 (GAUZE/BANDAGES/DRESSINGS) ×2
CLAMP CORD UMBIL (MISCELLANEOUS) IMPLANT
CLIP FILSHIE TUBAL LIGA STRL (Clip) ×1 IMPLANT
CLOTH BEACON ORANGE TIMEOUT ST (SAFETY) ×2 IMPLANT
DRAPE LG THREE QUARTER DISP (DRAPES) ×2 IMPLANT
DRSG OPSITE POSTOP 4X10 (GAUZE/BANDAGES/DRESSINGS) ×2 IMPLANT
DURAPREP 26ML APPLICATOR (WOUND CARE) ×2 IMPLANT
ELECT REM PT RETURN 9FT ADLT (ELECTROSURGICAL) ×2
ELECTRODE REM PT RTRN 9FT ADLT (ELECTROSURGICAL) ×1 IMPLANT
EXTRACTOR VACUUM M CUP 4 TUBE (SUCTIONS) IMPLANT
GLOVE BIOGEL PI IND STRL 7.0 (GLOVE) ×1 IMPLANT
GLOVE BIOGEL PI INDICATOR 7.0 (GLOVE) ×1
GLOVE ECLIPSE 7.0 STRL STRAW (GLOVE) ×4 IMPLANT
GOWN PREVENTION PLUS XLARGE (GOWN DISPOSABLE) ×2 IMPLANT
GOWN STRL REIN XL XLG (GOWN DISPOSABLE) ×4 IMPLANT
KIT ABG SYR 3ML LUER SLIP (SYRINGE) IMPLANT
NDL HYPO 25X5/8 SAFETYGLIDE (NEEDLE) IMPLANT
NEEDLE HYPO 22GX1.5 SAFETY (NEEDLE) ×2 IMPLANT
NEEDLE HYPO 25X5/8 SAFETYGLIDE (NEEDLE) IMPLANT
NS IRRIG 1000ML POUR BTL (IV SOLUTION) ×2 IMPLANT
PACK C SECTION WH (CUSTOM PROCEDURE TRAY) ×2 IMPLANT
PAD OB MATERNITY 4.3X12.25 (PERSONAL CARE ITEMS) ×2 IMPLANT
RTRCTR C-SECT PINK 25CM LRG (MISCELLANEOUS) ×1 IMPLANT
STAPLER VISISTAT 35W (STAPLE) IMPLANT
STRIP CLOSURE SKIN 1/2X4 (GAUZE/BANDAGES/DRESSINGS) ×1 IMPLANT
SUT VIC AB 0 CTX 36 (SUTURE) ×6
SUT VIC AB 0 CTX36XBRD ANBCTRL (SUTURE) ×3 IMPLANT
SUT VIC AB 4-0 KS 27 (SUTURE) IMPLANT
SYR 30ML LL (SYRINGE) ×2 IMPLANT
TAPE CLOTH SURG 4X10 WHT LF (GAUZE/BANDAGES/DRESSINGS) ×1 IMPLANT
TOWEL OR 17X24 6PK STRL BLUE (TOWEL DISPOSABLE) ×2 IMPLANT
TRAY FOLEY CATH 14FR (SET/KITS/TRAYS/PACK) ×2 IMPLANT
WATER STERILE IRR 1000ML POUR (IV SOLUTION) ×2 IMPLANT

## 2013-04-09 NOTE — H&P (Signed)
  [name] is an 32 y.o. [id number] [redacted]w[redacted]d female.   Chief Complaint: IUP @ 39 wks, prev. C-S x 2  HPI: [id number] @ [redacted]w[redacted]d with 2 previous C-S who desires elective repeat and BTL.  History reviewed. No pertinent past medical history.  Past Surgical History  Procedure Laterality Date  . Cesarean section  2003  . Cesarean section  2005    Family History  Problem Relation Age of Onset  . Hypertension Mother   . Heart attack Father   . Hypertension Father    Social History:  reports that she has never smoked. She has never used smokeless tobacco. Her alcohol and drug histories are not on file.  Allergies: No Known Allergies  Medications Prior to Admission  Medication Sig Dispense Refill  . Prenatal Vit-Fe Fumarate-FA (PRENATAL MULTIVITAMIN) TABS Take 1 tablet by mouth daily at 12 noon.         A comprehensive review of systems was negative.  Blood pressure 117/72, pulse 72, temperature 98.1 F (36.7 C), temperature source Oral, resp. rate 16, last menstrual period 06/26/2012, SpO2 99.00%. BP 117/72  Pulse 72  Temp(Src) 98.1 F (36.7 C) (Oral)  Resp 16  SpO2 99%  LMP 06/26/2012 General appearance: alert, cooperative and appears stated age Head: Normocephalic, without obvious abnormality, atraumatic Lungs: clear to auscultation bilaterally Heart: regular rate and rhythm, S1, S2 normal, no murmur, click, rub or gallop Abdomen: soft, non-tender; bowel sounds normal; no masses,  no organomegaly gravid Extremities: extremities normal, atraumatic, no cyanosis or edema Pulses: 2+ and symmetric Skin: Skin color, texture, turgor normal. No rashes or lesions Neurologic: Grossly normal  Prenatal Transfer Tool  Maternal Diabetes: No Genetic Screening: Declined Maternal Ultrasounds/Referrals: Normal Fetal Ultrasounds or other Referrals:  None Maternal Substance Abuse:  No Significant Maternal Medications:  None Significant Maternal Lab Results: Lab values include: Group B  Strep negative     Lab Results  Component Value Date   WBC 13.1* 04/08/2013   HGB 11.8* 04/08/2013   HCT 35.9* 04/08/2013   MCV 92.3 04/08/2013   PLT 191 04/08/2013   Lab Results  Component Value Date   PREGTESTUR  Value: NEGATIVE        THE SENSITIVITY OF THIS METHODOLOGY IS >24 mIU/mL 09/04/2009     Assessment/Plan Patient Active Problem List   Diagnosis Date Noted  . Anemia, iron deficiency 12/16/2012  . Supervision of other normal pregnancy 10/18/2012   For ELTCS and BTL  Delan Ksiazek S 04/09/2013, 9:22 AM

## 2013-04-09 NOTE — Lactation Note (Signed)
This note was copied from the chart of Girl Tanayia Aguilar-Puga. Lactation Consultation Note Initial assessment in PACU, mom states she wants to breast feed. Reviewed br feeding basics, lactation brochure provided; mom made aware of lactation services and community resources.  Mom holding baby STS, assisted mom to latch baby on the right, baby took a few suckles with a wide, comfortable latch, then went back to sleep.  Enc mom to continue frequent STS and cue based feeding, and to call for help if needed.  FOB at bedside interpreting.   Patient Name: Girl Zinnia Tindall [id number] Date: 04/09/2013 Reason for consult: Initial assessment   Maternal Data Formula Feeding for Exclusion: No Infant to breast within first hour of birth: Yes Has patient been taught Hand Expression?: Yes Does the patient have breastfeeding experience prior to this delivery?: Yes  Feeding Feeding Type: Breast Milk Length of feed: 0 min (few sucks)  LATCH Score/Interventions Latch: Repeated attempts needed to sustain latch, nipple held in mouth throughout feeding, stimulation needed to elicit sucking reflex.  Audible Swallowing: A few with stimulation  Type of Nipple: Everted at rest and after stimulation  Comfort (Breast/Nipple): Soft / non-tender     Hold (Positioning): Assistance needed to correctly position infant at breast and maintain latch. Intervention(s): Breastfeeding basics reviewed;Support Pillows;Skin to skin  LATCH Score: 7  Lactation Tools Discussed/Used     Consult Status Consult Status: Follow-up Follow-up type: In-patient    Octavio Manns [facility] 04/09/2013, 12:19 PM

## 2013-04-09 NOTE — Op Note (Signed)
Preoperative Diagnosis:  IUP @ [redacted]w[redacted]d, Previous C-section x 2, Undesired fertility  Postoperative Diagnosis:  Same  Procedure: Repeat low transverse cesarean section, Bilateral Tubal Ligation with Filshie clips  Surgeon: Tinnie Gens, M.D.  Assistant: Gildardo Cranker, MD  Anesthesia: spinal with Cristela Blue, MD Findings: Viable female infant, APGAR (1 MIN): 9   APGAR (5 MINS): 9  Weight pending Normal tubes and ovaries  Estimated blood loss: 1000 cc  Complications: None known  Specimens: Placenta to labor and delivery  Reason for procedure: Briefly, the patient is a 32 y.o. [id number] at [redacted]w[redacted]d with h/o previous C-section X 2 who desires permanent sterility.  Patient counseled, r.e. Risks benefits of BTL, including permanency of procedure, risk of failure(1:100), increased risk of ectopic.  Patient verbalized understanding and desires to proceed   Procedure: Patient is a to the OR where spinal analgesia was administered. She was then placed in a supine position with left lateral tilt. She received 2 g of Ancef and SCDs were in place. A Foley catheter was placed in the bladder. She was prepped and draped in the usual sterile fashion. A timeout was performed. A knife was then used to make a Pfannenstiel incision. This incision was carried out to underlying fascia which was divided in the midline with the knife. The incision was extended laterally, sharply. The fascia was dissected of the underlying rectus superiorly.  The rectus was divided in the midline.  The peritoneal cavity was entered bluntly.  Alexis retractor was placed inside the incision.  A knife was used to make a low transverse incision on the uterus. This incision was carried down to the amniotic cavity was entered. Fetus was in LOA position and was brought up out of the incision without difficulty. Cord was clamped x 2 and cut. Infant taken to waiting pediatrician. Placenta was delivered from the uterus.  Uterus was cleaned with dry lap  pads. Uterine incision closed with 0 Vicryl suture in a locked running fashion. Attention was turned to the pt's left tube which was grasped with a Babcock clamp and followed to its fimbriated end.  A Filshie clip was placed across the tube 1.5 cm from the cornu.  Attention was turned to the pt's right tube which was grasped with a Babcock clamp and followed to its fimbriated end.  A Filshie clip was placed across the tube 1.5 cm from the cornu. A 1cc of 0.25% Marcaine was injected into the surrounding tubes bilaterally.  Alexis retractor was removed from the abdomen. Peritoneal closure was done with 0 Vicryl suture.  Fascia is closed with 0 Vicryl suture in a running fashion. Subcutaneous tissue infused with 30cc 0.25% Marcaine.  Skin closed using 3-0 Vicryl on a Keith needle.  Steri strips applied, followed by pressure dressing.  All instrument, needle and lap counts were correct x 2.  Patient was awake and taken to PACU stable.  Infant to Newborn Nursery, stable.

## 2013-04-09 NOTE — Anesthesia Procedure Notes (Signed)

## 2013-04-09 NOTE — Lactation Note (Signed)
This note was copied from the chart of Hayley Blakeley Aguilar-Puga. Lactation Consultation Note   Follow up consult with this mom and baby, now 9 hours post partum. Mom was holding baby dressed and wrapped in blanket, saying she had not fed in many hours. I placed baby skin to skin, and l;atchaed her in reclined cross cradle, and she latched and suckled well, alert while feeding. Basic teaching on cue based and cluster feeding done. Lactation folder revewied with mom. Mom knows to call for questions/concerns.  Patient Name: [name] [id number] Date: 04/09/2013 Reason for consult: Follow-up assessment   Maternal Data    Feeding Feeding Type: Breast Milk  LATCH Score/Interventions Latch: Grasps breast easily, tongue down, lips flanged, rhythmical sucking. Intervention(s): Adjust position;Assist with latch  Audible Swallowing: None  Type of Nipple: Everted at rest and after stimulation  Comfort (Breast/Nipple): Soft / non-tender     Hold (Positioning): Assistance needed to correctly position infant at breast and maintain latch. Intervention(s): Breastfeeding basics reviewed;Support Pillows;Position options;Skin to skin  LATCH Score: 7  Lactation Tools Discussed/Used     Consult Status Consult Status: Follow-up Date: 04/10/13 Follow-up type: In-patient    Alfred Levins 04/09/2013, 7:11 PM

## 2013-04-09 NOTE — Transfer of Care (Signed)
Immediate Anesthesia Transfer of Care Note  Patient: [name]  Procedure(s) Performed: Procedure(s): REPEAT CESAREAN SECTION WITH BILATERAL TUBAL LIGATION (Bilateral)  Patient Location: PACU  Anesthesia Type:Spinal  Level of Consciousness: awake, alert , oriented and patient cooperative  Airway & Oxygen Therapy: Patient Spontanous Breathing  Post-op Assessment: Report given to PACU RN and Post -op Vital signs reviewed and stable  Post vital signs: stable  Complications: No apparent anesthesia complications

## 2013-04-09 NOTE — Anesthesia Postprocedure Evaluation (Signed)
  Anesthesia Post-op Note  Patient: [name]  Procedure(s) Performed: Procedure(s): REPEAT CESAREAN SECTION WITH BILATERAL TUBAL LIGATION (Bilateral)  Patient Location: Mother/Baby  Anesthesia Type:Spinal  Level of Consciousness: awake, alert  and oriented  Airway and Oxygen Therapy: Patient Spontanous Breathing  Post-op Pain: mild  Post-op Assessment: Post-op Vital signs reviewed, Patient's Cardiovascular Status Stable, No headache, No backache, No residual numbness and No residual motor weakness  Post-op Vital Signs: Reviewed and stable  Complications: No apparent anesthesia complications

## 2013-04-09 NOTE — Anesthesia Postprocedure Evaluation (Signed)
  Anesthesia Post-op Note  Patient: [name]  Procedure(s) Performed: Procedure(s): REPEAT CESAREAN SECTION WITH BILATERAL TUBAL LIGATION (Bilateral)  Patient Location: PACU  Anesthesia Type:Spinal  Level of Consciousness: awake, alert  and oriented  Airway and Oxygen Therapy: Patient Spontanous Breathing  Post-op Pain: none  Post-op Assessment: Post-op Vital signs reviewed, Patient's Cardiovascular Status Stable, Respiratory Function Stable, Patent Airway, No signs of Nausea or vomiting, Pain level controlled, No headache and No backache  Post-op Vital Signs: Reviewed and stable  Complications: No apparent anesthesia complications

## 2013-04-09 NOTE — Anesthesia Preprocedure Evaluation (Signed)

## 2013-04-10 ENCOUNTER — Encounter (HOSPITAL_COMMUNITY): Payer: Self-pay | Admitting: Family Medicine

## 2013-04-10 LAB — CBC
HCT: 21.4 % — ABNORMAL LOW (ref 36.0–46.0)
Hemoglobin: 7.1 g/dL — ABNORMAL LOW (ref 12.0–15.0)
RBC: 2.33 MIL/uL — ABNORMAL LOW (ref 3.87–5.11)
WBC: 15.3 10*3/uL — ABNORMAL HIGH (ref 4.0–10.5)

## 2013-04-10 LAB — CCBB MATERNAL DONOR DRAW

## 2013-04-10 LAB — BIRTH TISSUE RECOVERY COLLECTION (PLACENTA DONATION)

## 2013-04-10 NOTE — Progress Notes (Signed)
Post Partum Day 1 Subjective: no complaints, up ad lib, voiding, tolerating PO and + flatus  Objective: Blood pressure 100/62, pulse 92, temperature 98.6 F (37 C), temperature source Oral, resp. rate 20, height 5\' 3"  (1.6 m), weight 85.276 kg (188 lb), last menstrual period 06/26/2012, SpO2 98.00%, unknown if currently breastfeeding.  Physical Exam:  General: alert, cooperative and appears stated age Lochia: appropriate Uterine Fundus: firm Incision: healing well, no significant drainage DVT Evaluation: No evidence of DVT seen on physical exam.   Recent Labs  04/08/13 0843  HGB 11.8*  HCT 35.9*    Assessment/Plan: Plan for discharge tomorrow, Breastfeeding and Lactation consult   LOS: 1 day   Bryan R. Hess, DO of [facility] 04/10/2013, 7:07 AM  I have seen and examined this patient and agree with above documentation in the resident's note.   Rulon Abide, M.D. [facility] Fellow 04/10/2013 8:35 AM

## 2013-04-11 LAB — CBC
HCT: 19.3 % — ABNORMAL LOW (ref 36.0–46.0)
Hemoglobin: 6.4 g/dL — CL (ref 12.0–15.0)
MCH: 30.9 pg (ref 26.0–34.0)
MCV: 93.2 fL (ref 78.0–100.0)
Platelets: 152 10*3/uL (ref 150–400)
RBC: 2.07 MIL/uL — ABNORMAL LOW (ref 3.87–5.11)
WBC: 14.2 10*3/uL — ABNORMAL HIGH (ref 4.0–10.5)

## 2013-04-11 MED ORDER — OXYCODONE-ACETAMINOPHEN 5-325 MG PO TABS: 1.0000 | ORAL_TABLET | ORAL | Status: DC | PRN

## 2013-04-11 NOTE — Progress Notes (Signed)
CRITICAL VALUE ALERT  Critical value received: Hgb 6.4  Date of notification:  04/11/2013  Time of notification:  06:17  Critical value read back:YES  Nurse who received alert:  Hart Carwin, RN  MD notified (1st page):  Dr. Caleb Popp  Time of first page:  0623  MD notified (2nd page):  Time of second page:  Responding MD: Dr. Caleb Popp  Time MD responded:  [phone]

## 2013-04-11 NOTE — Discharge Summary (Signed)
Obstetric Discharge Summary Reason for Admission: cesarean section Prenatal Procedures: none Intrapartum Procedures: cesarean: low cervical, transverse Postpartum Procedures: none Complications-Operative and Postpartum: none Hemoglobin  Date Value Range Status  04/11/2013 6.4* 12.0 - 15.0 g/dL Final     REPEATED TO VERIFY     CRITICAL RESULT CALLED TO, READ BACK BY AND VERIFIED WITH:     T STANIN 04/11/13 AT 0613 BY H SOEWARDIMAN  11/18/2012 10.3* 12.2 - 16.2 g/dL Final     capillary sample     HCT  Date Value Range Status  04/11/2013 19.3* 36.0 - 46.0 % Final    Physical Exam:  General: alert, cooperative and appears stated age 32: appropriate Uterine Fundus: firm Incision: healing well, no significant drainage, no dehiscence, no significant erythema DVT Evaluation: No evidence of DVT seen on physical exam. Gait: Normal   Discharge Diagnoses: Term Pregnancy-delivered, C- Section.  Pt extremely adamant about leaving today and does not have Sx of anemia.  Specific instructions if feels lightheaded, dizzy, short of breath, to return to MAU this weekend.  Otherwise will see back Monday and recheck CBC as her low Hgb most likely secondary to blood loss from C-Section.  To continue with Fe Sulfate 325 mg TID as well.    Discharge Information: Date: 04/11/2013 Activity: pelvic rest Diet: routine Medications: Colace, Iron and Percocet Condition: stable Instructions: refer to practice specific booklet Discharge to: home   Newborn Data: Live born female  Birth Weight: 8 lb 11.3 oz (3950 g) APGAR: 9, 9  Home with mother.  Twana First Hess, DO of [facility] 04/11/2013, 8:27 AM  I have seen and examined this patient and agree with above documentation in the resident's note. Pt with asymptomatic anemia to 6.4 after c-section. Denies any lightheadedness and has support from husband at home. Requesting discharge today. Will d/c with plans to f/u on Monday for recheck and CBC  with FM clinic.  She is breast feeding and undecided about contraception. Her incision is dry and intact.   Rulon Abide, M.D. [facility] Fellow 04/11/2013 10:23 AM

## 2013-04-15 ENCOUNTER — Inpatient Hospital Stay: Payer: No Typology Code available for payment source | Admitting: Family Medicine

## 2013-04-18 ENCOUNTER — Ambulatory Visit (INDEPENDENT_AMBULATORY_CARE_PROVIDER_SITE_OTHER): Payer: No Typology Code available for payment source | Admitting: Family Medicine

## 2013-04-18 VITALS — BP 97/66 | HR 67 | Temp 98.2°F | Wt 170.0 lb

## 2013-04-18 DIAGNOSIS — Z9889 Other specified postprocedural states: Secondary | ICD-10-CM

## 2013-04-18 DIAGNOSIS — Z98891 History of uterine scar from previous surgery: Secondary | ICD-10-CM | POA: Insufficient documentation

## 2013-04-18 LAB — CBC
HCT: 27.4 % — ABNORMAL LOW (ref 36.0–46.0)
Hemoglobin: 9.1 g/dL — ABNORMAL LOW (ref 12.0–15.0)
MCH: 30.6 pg (ref 26.0–34.0)
MCHC: 33.2 g/dL (ref 30.0–36.0)
MCV: 92.3 fL (ref 78.0–100.0)
RDW: 14.8 % (ref 11.5–15.5)

## 2013-04-18 NOTE — Patient Instructions (Addendum)
Everything looks good today. If you have increased bleeding or pain, please go to [facility].  We will call you if your lab is low.  Brielynn Sekula M. Avangeline Stockburger, M.D.

## 2013-04-18 NOTE — Assessment & Plan Note (Signed)
C-section site wnl. Did not place large dressing over the area today. Instructed to leave the steristrips intact until they fall off. Will check CBC, but patient will likely need iron supplement. F/u with Dr. Paulina Fusi or in MAU if she notices bleeding, pain or drainage.

## 2013-04-18 NOTE — Progress Notes (Signed)
Patient ID: [name], female   DOB: 08/05/1981, 32 y.o.   MRN: [id number]  [facility] Veona Bittman M. Kalep Full, MD Phone: [phone]   Subjective: HPI: Patient is a 32 y.o. female presenting to clinic today for c-section follow up. She is now 5 days post-partum. Dressing has been in place for 3 days but has not noticed any bleeding. She feels well; minimal pain. Breastfeeding well. Her HgB was low postpartum and she states she is not taking iron, but does not feel weak or lightheaded.   Phone interpreter used for interview.  History Reviewed: Non-smoker.  ROS: Please see HPI above.  Objective: Office vital signs reviewed. BP 97/66  Pulse 67  Temp(Src) 98.2 F (36.8 C)  Wt 170 lb (77.111 kg)  BMI 30.12 kg/m2  LMP 06/26/2012  Physical Examination:  General: Awake, alert. NAD. Husband at bedside Pulm: CTAB, no wheezes Cardio: RRR, no murmurs appreciated Abdomen: soft, nontender, nondistended. Linear c-section incision with dressing covering the steristrips. Small amount of bleeding on right corner but no dehiscence or drainage.   Extremities: No edema Neuro: Grossly intact  Assessment: 32 y.o. female s/p C-section  Plan: See Problem List and After Visit Summary

## 2013-05-20 ENCOUNTER — Encounter: Payer: Self-pay | Admitting: Family Medicine

## 2013-05-20 ENCOUNTER — Ambulatory Visit (INDEPENDENT_AMBULATORY_CARE_PROVIDER_SITE_OTHER): Payer: Medicaid Other | Admitting: Family Medicine

## 2013-05-20 ENCOUNTER — Ambulatory Visit: Payer: Medicaid Other

## 2013-05-20 VITALS — BP 108/61 | HR 53 | Ht 63.0 in | Wt 163.0 lb

## 2013-05-20 DIAGNOSIS — Z98891 History of uterine scar from previous surgery: Secondary | ICD-10-CM

## 2013-05-20 DIAGNOSIS — Z9889 Other specified postprocedural states: Secondary | ICD-10-CM

## 2013-05-20 DIAGNOSIS — Z1331 Encounter for screening for depression: Secondary | ICD-10-CM

## 2013-05-20 DIAGNOSIS — D509 Iron deficiency anemia, unspecified: Secondary | ICD-10-CM

## 2013-05-20 NOTE — Assessment & Plan Note (Signed)
Most likely hemo dilutional component to this secondary to pregnancy, will repeat CBC today as she has been compliant with her Fe 325 mg BID.

## 2013-05-20 NOTE — Assessment & Plan Note (Signed)
C-Section site looks nml with minimal pain at this time, able to have BM w/o straining or pain.  F/U PRN as she had BTL performed after the C-Section.

## 2013-05-20 NOTE — Assessment & Plan Note (Signed)
Screening for post-partum depression at 6 week check, PHQ 9 of 0.  No SI/HI at this time.

## 2013-05-20 NOTE — Progress Notes (Signed)
[name] is a 32 y.o. female who presents today for postpartum f/u.    Pt doing well today with no acute complaints.  She denies any depression, trouble with breast feeding, trouble with home, C-section incision pain, erythema or decreased healing.   No past medical history on file.  History  Smoking status  . Never Smoker   Smokeless tobacco  . Never Used    Family History  Problem Relation Age of Onset  . Hypertension Mother   . Heart attack Father   . Hypertension Father     Current Outpatient Prescriptions on File Prior to Visit  Medication Sig Dispense Refill  . oxyCODONE-acetaminophen (PERCOCET/ROXICET) 5-325 MG per tablet Take 1-2 tablets by mouth every 4 (four) hours as needed.  40 tablet  0  . Prenatal Vit-Fe Fumarate-FA (PRENATAL MULTIVITAMIN) TABS Take 1 tablet by mouth daily at 12 noon.       No current facility-administered medications on file prior to visit.    ROS: Per HPI.  All other systems reviewed and are negative.   Physical Exam Filed Vitals:   05/20/13 1032  BP: 108/61  Pulse: 53    Physical Examination: General appearance - alert, well appearing, and in no distress Mental status - normal mood, behavior, speech, dress, motor activity, and thought processes Heart - normal rate and regular rhythm, no murmurs noted Abdomen - soft, nontender, nondistended, no masses or organomegaly C-section incision healing well, no erythema or TTP around the area  Back exam - full range of motion, no tenderness, palpable spasm or pain on motion   Lab Results  Component Value Date   WBC 11.3* 04/18/2013   HGB 9.1* 04/18/2013   HCT 27.4* 04/18/2013   MCV 92.3 04/18/2013   PLT 447* 04/18/2013

## 2013-05-20 NOTE — Patient Instructions (Signed)
Lactancia materna  (Breastfeeding)  El cambio hormonal durante el Psychiatrist produce el desarrollo del tejido [place] y un aumento en el nmero y tamao de los conductos galactforos. La hormona prolactina permite que las protenas, los azcares y las grasas de la sangre produzcan la [employer] materna en las glndulas productoras de [place]. La hormona progesterona impide que la leche materna sea liberada antes del nacimiento del beb. Despus del nacimiento del beb, su nivel de progesterona disminuye permitiendo que la leche materna sea [place]. Pensar en el beb, as como la succin o Theatre manager, pueden estimular la liberacin de [place] de las glndulas productoras de [place].  La decisin de Company secretary) es una de las mejores opciones que usted puede hacer para usted y su beb. La informacin que sigue da una breve resea de los beneficios, as Lexicographer que debe saber sobre la [place].  LOS BENEFICIOS DE AMAMANTAR  Para el beb   La primera leche (calostro) ayuda al mejor funcionamiento del sistema digestivo del beb.   La leche tiene anticuerpos que provienen de la madre y que ayudan a prevenir las infecciones en el beb.   El beb tiene una menor incidencia de asma, alergias y del sndrome de muerte sbita del lactante (SMSL).   Los nutrientes de la [place] materna son mejores para el beb que la [place].  La leche materna mejora el desarrollo cerebral del beb.   Su beb tendr menos gases, clicos y estreimiento.  Es menos probable que el beb desarrolle otras enfermedades, como obesidad infantil, asma o diabetes mellitus. Para usted   La lactancia materna favorece el desarrollo de un vnculo muy especial entre la madre y el beb.   Es ms conveniente, siempre disponible, a la [place] y [place].   La lactancia materna ayuda a quemar caloras y a perder el peso ganado durante el [place].   Hace que el tero se  contraiga ms rpidamente a su tamao normal y [employer] sangrado despus del [place].   Las [employer] que amamantan tienen menos riesgo de Environmental education officer osteoporosis o cncer de mama o de ovario en el futuro.  FRECUENCIA DEL AMAMANTAMIENTO   Un beb sano, nacido a trmino, puede amamantarse con tanta frecuencia como cada hora, o espaciar las comidas cada tres horas. La frecuencia en la lactancia varan de un beb a otro.   Los recin nacidos deben ser alimentados por lo menos cada 2-3 horas Administrator y cada 4-5 horas durante la noche. Usted debe amamantarlo un mnimo de 8 tomas en un perodo de 24 horas.  Despierte al beb para amamantarlo si han pasado 3-4 horas desde la ltima comida.  Amamante cuando sienta la necesidad de reducir la plenitud de sus senos o cuando el beb muestre signos de [place]. Las seales de que el beb puede Gentry Fitz son:  Lenora Boys su estado de alerta o vigilancia.  Se estira.  Mueve la cabeza de un lado a otro.  Mueve la cabeza y abre la boca cuando se le toca la mejilla o la boca (reflejo de succin).  Aumenta las vocalizaciones, tales como sonidos de succin, relamerse los labios, arrullos, suspiros, o chirridos.  Mueve la [employer] boca.  Se chupa con ganas los dedos o las manos.  Agitacin.  Llanto intermitente.  Los signos de hambre extrema requerirn que lo calme y lo consuele antes de tratar de alimentarlo. Los signos de hambre extrema son:  Agitacin.  Llanto fuerte e intenso.  Gritos.  El amamantamiento frecuente la ayudar a producir ms [place] y a Education officer, community de Engineer, mining en los pezones e hinchazn de las [place].  LACTANCIA MATERNA   Ya sea que se encuentre acostada o sentada, asegrese que el abdomen del beb est enfrente el suyo.   Sostenga la mama con el pulgar por arriba y los otros 4 dedos por debajo del pezn. Asegrese que sus dedos se encuentren lejos del pezn y de la boca del beb.   Empuje suavemente los  labios del beb con el pezn o con el dedo.   Cuando la boca del beb se abra lo suficiente, introduzca el pezn y la zona oscura que lo rodea (areola) tanto como le sea posible dentro de la boca.  Debe haber ms areola visible por arriba del labio superior que por debajo del labio inferior.  La lengua del beb debe estar entre la enca inferior y el seno.  Asegrese de que la boca del beb est en la posicin correcta alrededor del pezn (prendida). Los labios del beb deben crear un sello sobre su pecho.  Las seales de que el beb se ha prendido eficazmente al pezn son:  Payton Doughty o succiona sin dolor.  Se escucha que traga [employer].  No hace ruidos ni chasquidos.  Hay movimientos musculares por arriba y por delante de sus odos al Printmaker.  El beb debe succionar unos 2-3 minutos para que salga la [place]. Permita que el nio se alimente en cada mama todo lo que desee. Alimente al beb hasta que se desprenda o se quede dormido en Freight forwarder y luego ofrzcale el segundo pecho.  Las seales de que el beb est lleno y satisfecho son:  Disminuye gradualmente el nmero de succiones o no succiona.  Se queda dormido.  Extiende o relaja su cuerpo.  Retiene una pequea cantidad de [facility] boca.  Se desprende del pecho por s mismo.  Los signos de una lactancia materna eficaz son:  Los senos han aumentado la firmeza, el peso y el tamao antes de la alimentacin.  Son ms blandos despus de amamantar.  Un aumento del volumen de [place], y tambin el cambio de su consistencia y color se producen hacia el quinto da de Tour manager.  La congestin mamaria se [place] al dar de [place].  Los pezones no duelen, ni estn agrietados ni sangran.  De ser necesario, interrumpa la succin poniendo su dedo en la esquina de la boca del beb y deslizando el dedo entre sus encas. A continuacin, retire la mama de su boca.  Es comn que los bebs regurgiten un poco  despus de comer.  A menudo los bebs tragan aire al alimentarse. Esto puede hacer que se sienta molesto. Hacer eructar al beb al Pilar Plate de pecho puede ser de [place].  Se recomiendan suplementos de vitamina D para los bebs que reciben slo [address].  Evite el uso del chupete durante las primeras 4 a 6 semanas de vida.  Evite la alimentacin suplementaria con agua, frmula o jugo en lugar de la [employer]. La leche materna es todo el alimento que el beb necesita. No es necesario que el nio ingiera agua o preparados de bibern. Sus pechos producirn ms leche si se evita la alimentacin suplementaria durante las primeras semanas. COMO SABER SI EL BEB OBTIENE LA SUFICIENTE LECHE MATERNA  Preguntarse si el beb obtiene la cantidad suficiente de [place] es una preocupacin frecuente [employer]. Puede asegurarse que el  beb tiene la leche suficiente si:   El beb succiona activamente y usted escucha que traga.   El beb parece estar relajado y satisfecho despus de Psychologist, clinical.   El nio se alimenta al menos 8 a 12 veces en 24 horas.  Durante los primeros 3 a 5 das de vida:  Moja 3-5 paales en 24 horas. La materia fecal debe ser blanda y [place].  Tiene al menos 3 a 4 deposiciones en 24 horas. La materia fecal debe ser blanda y [place].  A los 5-7 das de vida, el beb debe tener al menos 3-6 deposiciones en 24 horas. La materia fecal debe ser grumosa y [place] a los [address]  [address] de [place].  Su beb tiene una prdida de Psychologist, counselling a 7al 10% durante los primeros 3 [address]  [address] de [address]  El beb no pierde peso despus de 3-7 [address] de [address]  El beb debe aumentar 4 a 6 libras (120 a 170 gr.) por semana despus de los 4 [address] de vida.  Aumenta de [place] a los [address] de vida y vuelve al peso del nacimiento dentro de las 2 semanas. CONGESTIN MAMARIA  Durante la primera semana despus del [place], usted puede experimentar hinchazn en las mamas (congestin [place]). Al estar congestionadas,  las mamas se sienten pesadas, calientes o sensibles al tacto. El pico de la congestin ocurre a las 24 -48 horas despus del parto.   La congestin puede disminuirse:  Continuando con la Tour manager.  Aumentando la frecuencia.  Tomando duchas calientes o aplicando calor hmedo en los senos antes de cada comida. Esto aumenta la circulacin y [place] a que la Industry.   Masajeando suavemente el pecho antes y [place] [employer]. Con las yemas de los dedos, masajee la pared del pecho hacia el pezn en un movimiento circular.   Asegurarse de que el beb vaca al menos uno de sus pechos en cada alimentacin. Tambin ayuda si comienza la siguiente toma en el otro seno.   Extraiga manualmente o con un sacaleches las mamas para vaciar los pechos si el beb tiene sueo o no se aliment bien. Tambin puede extraer la [employer] cuando vuelva a trabajar o si siente que se estn congestionando las [place].  Asegrese de que el beb se prende y est bien colocado durante la Market researcher. Si sigue estas indicaciones, la congestin debe mejorar en 24 a 48 horas. Si an tiene dificultades, consulte a Barista.  CUDESE USTED MISMA  Cuide sus mamas.   Bese o dchese diariamente.   Evite usar [employer].   Use un sostn de soporte Evite el uso de sostenes con aro.  Seque al aire sus pezones durante 3-4 minutos despus de cada comida.   Utilice slo apsitos de algodn en el sostn para absorber las prdidas de [place]. La prdida de un poco de [employer] las comidas es normal.   Use solamente lanolina pura en sus pezones despus de Museum/gallery exhibitions officer. Usted no tiene que lavarla antes de alimentar al beb. Otra opcin es sacarse unas gotas de [place] y [employer] pezones.  Continuar con los autocontroles de la mama. Cudese.   Consuma alimentos saludables. Alterne 3 comidas con 3 colaciones.  Evite los alimentos que usted nota que perjudican al beb.  Dixie Dials, jugos de fruta y agua para Patent examiner su sed (aproximadamente 8 vasos al Futures trader).   Descanse con frecuencia, reljese y tome sus vitaminas prenatales para evitar la fatiga, el estrs y  la anemia.  Evite masticar y fumar tabaco.  Evite el consumo de alcohol y drogas.  Tome medicamentos de venta libre y recetados tal como le indic su mdico o Social research officer, government. Siempre debe consultar con su mdico o farmacutico antes de tomar cualquier medicamento, vitamina o suplemento de hierbas.  Sepa que durante la lactancia puede quedar embarazada. Si lo desea, hable con su mdico acerca de la planificacin familiar y los mtodos anticonceptivos seguros que puede utilizar durante la Market researcher. SOLICITE ATENCIN MDICA SI:   Usted siente que quiere dejar de Museum/gallery exhibitions officer o se siente frustrada con la lactancia.  Siente dolor en los senos o en los pezones.  Sus pezones estn agrietados o Water quality scientist.  Sus pechos estn irritados, sensibles o calientes.  Tiene un rea hinchada en cualquiera de los senos.  Siente escalofros o fiebre.  Tiene nuseas o vmitos.  Observa un drenaje en los pezones.  Sus mamas no se llenan antes de Marine scientist al 5to da despus del [place].  Se siente triste y deprimida.  El nio est demasiado somnoliento como para comer.  El nio tiene problemas para Industrial/product designer.   Moja menos de 3 paales en 24 horas.  Mueve el intestino menos de 3 veces en 24 horas.  La piel del beb o la parte blanca de sus ojos est ms amarilla.   El beb no ha aumentado de Ruch a los [address] de [place]. ASEGRESE DE QUE:   Comprende estas instrucciones.  Controlar su enfermedad.  Solicitar ayuda de inmediato si no mejora o si empeora. Document Released: 07/24/2005 Document Revised: 04/17/2012 [facility] Patient Information 2014 [place], [place].

## 2013-05-21 LAB — CBC
MCH: 28.7 pg (ref 26.0–34.0)
MCHC: 32.2 g/dL (ref 30.0–36.0)
MCV: 89.1 fL (ref 78.0–100.0)
Platelets: 302 10*3/uL (ref 150–400)
RBC: 4.11 MIL/uL (ref 3.87–5.11)

## 2013-10-22 ENCOUNTER — Ambulatory Visit: Payer: Self-pay

## 2014-01-23 ENCOUNTER — Encounter: Payer: Self-pay | Admitting: Family Medicine

## 2014-01-23 ENCOUNTER — Ambulatory Visit (INDEPENDENT_AMBULATORY_CARE_PROVIDER_SITE_OTHER): Payer: No Typology Code available for payment source | Admitting: Family Medicine

## 2014-01-23 VITALS — BP 92/58 | HR 68 | Temp 99.0°F | Ht 63.0 in | Wt 156.2 lb

## 2014-01-23 DIAGNOSIS — R21 Rash and other nonspecific skin eruption: Secondary | ICD-10-CM

## 2014-01-23 DIAGNOSIS — R6882 Decreased libido: Secondary | ICD-10-CM | POA: Insufficient documentation

## 2014-01-23 LAB — TSH: TSH: 1.358 u[IU]/mL (ref 0.350–4.500)

## 2014-01-23 MED ORDER — TRIAMCINOLONE ACETONIDE 0.025 % EX OINT: 1.0000 "application " | TOPICAL_OINTMENT | Freq: Two times a day (BID) | CUTANEOUS | Status: DC

## 2014-01-23 NOTE — Assessment & Plan Note (Signed)
Discussed with Dr. Deirdre Priesthambliss and Dr. Randolm IdolFletke.  Most likely 2/2 increased stress from kids and no alone time.  Recommend researching online some ways to improve libido as well as some alone time with her husband.  Will get TSH and Vit D level today to r/o other causes.  Denies depression and last PHQ 9 in October 2014 was 0.  F/U in 6 weeks.

## 2014-01-23 NOTE — Patient Instructions (Signed)
Psoriasis (Psoriasis) La psoriasis es una inflamacin de la piel frecuente y crnica (de larga duracin). Afecta a hombres y mujeres por igual, en todas las edades y razas. No puede transmitirse de persona a persona (no es contagiosa). La psoriasis vara desde formas leves a graves. Cuando es grave, puede afectar su calidad de vida. La psoriasis es un trastorno inflamatorio que afecta a la piel as como a otros rganos, incluyendo las articulaciones (y causa artritis). En las personas con psoriasis, la piel pierde sus capas superiores ms rpidamente que en una persona sana.  CAUSAS  La causa es desconocida. Los factores genticos, el sistema inmunolgico y el ambiente parecen cumplir un papel en la causa de la psoriasis. Los factores que empeoran el problema son:   Lesiones o traumatismos como cortes, raspones y quemaduras de sol. Estos traumatismos causan nuevas zonas de psoriasis (lesiones).  La sequedad que produce el invierno y la falta de luz solar.  Los medicamentos como el litio, los betabloqueantes, medicamentos para la malaria, inhibidores de la enzima convertidora de angiotensina (ECA), antinflamatorios no esteroides (ibuprofeno, aspirina) y terbinafina. Hgale saber al profesional que lo asiste si toma alguno de estos medicamentos.  Alcohol Hay que evitar el consumo excesivo de alcohol. El consumo de grandes cantidades de alcohol puede afectar:  El xito del tratamiento.  La seguridad del tratamiento.  El hbito de fumar Si fuma, consulte con el profesional acerca de las opciones para dejar de hacerlo.  El estrs.  Infecciones virales o bacterianas.  Artritis La artritis asociada a la psoriasis (artritis psorisica) involucra a menos del 10% de los pacientes con psoriasis. La intensidad de la artritis no siempre se corresponde con la intensidad de la psoriasis. Es importante que informe a su mdico si le duelen las articulaciones o estn rgidas. SNTOMAS  La forma ms comn  comienza con pequeos bultos rojos que gradualmente se agrandan. Los bultos comienzan a formar escamas que se desprenden con facilidad. Las capas ms profundas de las escamas estn unidas. Cuando se rascan o se despegan, la piel subyacente est sensible y sangra con facilidad. Estas zonas van aumentando su tamao y pueden ser muy grandes. La psoriasis forma una erupcin que es similar en ambos lados del cuerpo (es simtrica). Generalmente afecta a los codos, rodillas, ingle, genitales, brazos, piernas, cuero cabelludo y uas. Las uas afectadas generalmente tienen marcas, se aflojan, se espesan y se escaman, y son difciles de tratar.   La psoriasis inversa aparece en las axilas, debajo de las mamas, en los pliegues de la piel y en la ingle, nalgas y genitales.  La psoriasis en gotas se presenta en los nios y adultos jvenes luego de un dolor de garganta angina estreptocccica). Comienza con pequeas manchas rojas y escamosas en la piel. Desaparece espontneamente en algunas semanas o en pocos meses, sin tratamiento. DIAGNSTICO  La psoriasis se diagnostica con un examen fsico. Le tomarn una muestra (biopsia) de tejido.  TRATAMIENTO  El tratamiento para esta enfermedad depende de la edad, el estado de salud y las condiciones de vida.   Podr utilizar cremas, lociones y ungentos con (corticoides). Estos tratamientos se asocian al adelgazamiento de la piel, aumento del tamao de los vasos sanguneos (dilatacin), prdida de la pigmentacin de la piel y hematomas que aparecen fcilmente. Es importante tomar todos los medicamentos segn las indicaciones del mdico. Solo deben tratarse las zonas afectadas y no la piel normal. Las personas que se encuentran bajo tratamiento con corticoides a largo plazo deben usar un brazalete   de alerta mdico. En las zonas difciles de tratar se aplicarn inyecciones.  Existen tratamientos para el cuero cabelludo en forma de champs, soluciones, aerosoles, espumas y  aceites. Evite rascarse y quitarse las costras.  En las zonas difciles de tratar se usar antralina. Pero mancha la ropa y la piel, y puede causar irritacin temporaria.  En las zonas pequeas puede utilizarse Vitamina D sinttica ( calcipotriol). Se vende bajo prescripcin mdica. Las formas de vitamina D sinttica disponibles en las tiendas de alimentos no mejoran la psoriasis.  Cuando es difcil de tratar, puede usarse una crema de alquitrn de hulla que viene en diferentes concentraciones. Este ha sido uno de los tratamientos ms importantes utilizados durante mucho tiempo en la resolucin de las psoriasis difciles de tratar. Sin embargo, su utilizacin es complicada.  La terapia lumnica(Terapia con rayos UV) puede llevarse a cabo cuidadosamente y de manera profesional en el consultorio del dermatlogo. Para muchas personas es beneficioso tomar baos de sol, con la autorizacin del mdico. La exposicin debe ser lo suficientemente prolongada como para causar un enrojecimiento leve (eritema)en la piel. Evite las quemaduras de sol, ya que pueden hacer que el trastorno empeore. La pantalla solar (filtro de 30 o ms) debe usarse para protegerse de las quemaduras. La terapia solar tiene algunos efectos secundarios, ya que puede ocasionar cataratas, arrugas y envejecimiento de la piel.  Si las cremas (medicamentos tpicos)fracasan, hay otras opciones de medicamentos sistmicos o por va oral que el mdico puede recomendar. En algunos casos la psoriasis pueden ser difcil de tratar. Puede irse y volver. Es necesario realizar controles con el mdico regularmente si su caso es difcil de tratar. Generalmente, con mucha paciencia, usted podr conseguir alivio en un gran porcentaje. Mantener un tratamiento consistente es muy importante. No cambie de un profesional a otro slo porque no obtenga resultados inmediatos. Es necesario realizar numerosos intentos hasta hallar la correcta combinacin de mtodos para  su tratamiento.  PREVENCIN DE LOS BROTES   Use guantes al lavar los platos, al limpiar y cuando est afuera en el fro.  Si tiene radiadores, coloque un recipiente con agua o una toalla hmeda sobre el mismo. Esto har que se humedezca el aire. Tambin puede usar un humidificador. Trate de mantener la humedad en un 60% dentro de su casa.  Aplique un humectante cuando la piel est hmeda luego del bao. Esto atrapar el agua en la piel.  Evite los baos o las duchas calientes prolongados. Use el mnimo de jabn. Los jabones secan la piel y quitan los aceites protectores. Use un jabn sin perfume y sin colorantes.  Beba gran cantidad de lquido para mantener la orina de tono claro o color amarillo plido. Al no beber la cantidad de lquido suficiente se disminuye el suministro de agua a la piel.  Apague la calefaccin durante la noche y mantngala baja durante el da. El aire fro seca menos la piel. SOLICITE ATENCIN MDICA SI:   Aumenta el dolor en la zona de la herida.  Tiene un sangrado que no puede controlar.  Aumentan el enrojecimiento y el calor en la zona de la herida.  Presenta algn dolor o hinchazn en las articulaciones.  Se siente deprimido por su enfermedad.  Tiene fiebre. Document Released: 05/03/2005 Document Revised: 10/16/2011 ExitCare Patient Information 2015 [employer]. This information is not intended to replace advice given to you by your health care provider. Make sure you discuss any questions you have with your health care provider.  

## 2014-01-23 NOTE — Progress Notes (Unsigned)
Patient has the orange card and is needing a referral to the dental clinic for a cleaning and fillings.

## 2014-01-23 NOTE — Assessment & Plan Note (Signed)
DDx includes very mild plaque psorisis vs eczema.  Will tx with topical kenalog 0.1 ointment BID until lesions dissipate, then topical lotion/cream as needed, and steroid PRN and f/u in 6 weeks.

## 2014-01-23 NOTE — Progress Notes (Signed)
[name] is a 33 y.o. female who presents today for B/L extensor surface rash around elbows and decreased libido.  Rash - B/L Elbows, has been there for a yr, previously had about 15 yrs ago, tried cream for this which helped (unsure type).  Sometimes pruritic, does have bleeding when scratches, does not spread, no changes in soaps, detergents, or shampoos.  Also has on her knees, which are very mild.  She has tried some OTC creams which have not helped.  She denies FHx of anything like this, joint pain, myalgias, fevers.    Decreased libido - Has been ongoing now for about 1 yr, previously during pregnancy, thought may be related to this.  Denies depression or anhedonia with any other events in life.  Has not tried anything for this.  Denies any increased fatigue and thinks she was on a medication for this about 3-4 yrs ago, unsure what it was, prescribed by outside clinic.   No past medical history on file.  History  Smoking status  . Never Smoker   Smokeless tobacco  . Never Used    Family History  Problem Relation Age of Onset  . Hypertension Mother   . Heart attack Father   . Hypertension Father     Current Outpatient Prescriptions on File Prior to Visit  Medication Sig Dispense Refill  . Prenatal Vit-Fe Fumarate-FA (PRENATAL MULTIVITAMIN) TABS Take 1 tablet by mouth daily at 12 noon.       No current facility-administered medications on file prior to visit.    ROS: Per HPI.  All other systems reviewed and are negative.   Physical Exam Filed Vitals:   01/23/14 0920  BP: 92/58  Pulse: 68  Temp: 99 F (37.2 C)    Physical Examination: General appearance - alert, well appearing, and in no distress Heart - normal rate, regular rhythm, normal S1, S2, no murmurs, rubs, clicks or gallops Abdomen - soft, nontender, nondistended, no masses or organomegaly Skin - circumscribed plaques on the extensor surface of elbow, very mild.  Some scaly patches on anterior knee  surfaces B/L.  No other rashes.     >50% of the 25 minute visit was spent counseling and discussing management options with the pt.

## 2014-01-24 LAB — VITAMIN D 25 HYDROXY (VIT D DEFICIENCY, FRACTURES): VIT D 25 HYDROXY: 47 ng/mL (ref 30–89)

## 2014-03-06 ENCOUNTER — Ambulatory Visit: Payer: No Typology Code available for payment source | Admitting: Family Medicine

## 2014-03-06 ENCOUNTER — Telehealth: Payer: Self-pay | Admitting: Clinical

## 2014-03-06 NOTE — Telephone Encounter (Signed)
CSW contacted pt to inform her that unfortunately her appt for today will need to be rescheduled. Pt understanding and agreeable to coming to see PCP on Tuesday, March 10, 2014 at 4:15.  Theresia BoughNorma Emmanuella Mirante, MSW, LCSW [phone]

## 2014-03-10 ENCOUNTER — Ambulatory Visit (INDEPENDENT_AMBULATORY_CARE_PROVIDER_SITE_OTHER): Payer: No Typology Code available for payment source | Admitting: Family Medicine

## 2014-03-10 ENCOUNTER — Encounter: Payer: Self-pay | Admitting: Family Medicine

## 2014-03-10 VITALS — BP 97/63 | HR 61 | Temp 98.2°F | Ht 63.0 in | Wt 157.0 lb

## 2014-03-10 DIAGNOSIS — R6882 Decreased libido: Secondary | ICD-10-CM

## 2014-03-10 DIAGNOSIS — R21 Rash and other nonspecific skin eruption: Secondary | ICD-10-CM

## 2014-03-10 NOTE — Assessment & Plan Note (Signed)
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  Continue with kenalog 0.1 ointment BID and topical Eucerin cream to area.  F/U in 2 months.

## 2014-03-10 NOTE — Assessment & Plan Note (Signed)
Continues to have decreased libido, most likely 2/2 to stress (babysitting kids and nephews most days of week).  Recommend again to try and get someone to babysit her kids and have 1-2 nights with her husband.

## 2014-03-10 NOTE — Patient Instructions (Signed)
Eucerin Cream three times per day.  Continue with kenalog  Thanks, Dr. Paulina FusiHess

## 2014-03-10 NOTE — Progress Notes (Signed)
[name] is a 33 y.o. female who presents today for B/L extensor surface rash around elbows and decreased libido.  Rash - B/L Elbows, has been there for a yr, previously had about 15 yrs ago, tried cream for this which helped (unsure type).  Sometimes pruritic, does have bleeding when scratches, does not spread, no changes in soaps, detergents, or shampoos.  Also has on her knees, which are very mild.  She has tried some OTC creams which have not helped.  She denies FHx of anything like this, joint pain, myalgias, fevers.  Has now been on kenalog ointment for 6 weeks with mild improvement.  She is not always compliant with this but states improvement in pruritis and scaling when she uses this.    Decreased libido - Has been ongoing now for about 1 yr, previously during pregnancy, thought may be related to this.  Denies depression or anhedonia with any other events in life.  Has not tried anything for this.  Denies any increased fatigue and thinks she was on a medication for this about 3-4 yrs ago, unsure what it was, prescribed by outside clinic. TSH and Vitamin D at June 2015 visit were both normal.  Thought to be due to stress/increased responsibilities and less time with her husband.  She has been unable to increase her time with her husband since last visit and has not noticed much of a difference.   No past medical history on file.  History  Smoking status  . Never Smoker   Smokeless tobacco  . Never Used    Family History  Problem Relation Age of Onset  . Hypertension Mother   . Heart attack Father   . Hypertension Father     Current Outpatient Prescriptions on File Prior to Visit  Medication Sig Dispense Refill  . Prenatal Vit-Fe Fumarate-FA (PRENATAL MULTIVITAMIN) TABS Take 1 tablet by mouth daily at 12 noon.      . triamcinolone (KENALOG) 0.025 % ointment Apply 1 application topically 2 (two) times daily.  454 g  3   No current facility-administered medications on file  prior to visit.    ROS: Per HPI.  All other systems reviewed and are negative.   Physical Exam Filed Vitals:   03/10/14 1625  BP: 97/63  Pulse: 61  Temp: 98.2 F (36.8 C)    Physical Examination: General appearance - alert, well appearing, and in no distress Heart - normal rate, regular rhythm, normal S1, S2, no murmurs, rubs, clicks or gallops Abdomen - soft, nontender, nondistended, no masses or organomegaly Skin - circumscribed plaques on the extensor surface of elbow, very mild.  Some scaly patches on anterior knee surfaces B/L.  No other rashes.     >50% of the 25 minute visit was spent counseling and discussing management options with the pt.

## 2014-05-13 ENCOUNTER — Ambulatory Visit (INDEPENDENT_AMBULATORY_CARE_PROVIDER_SITE_OTHER): Payer: Self-pay | Admitting: Family Medicine

## 2014-05-13 ENCOUNTER — Encounter: Payer: Self-pay | Admitting: Family Medicine

## 2014-05-13 VITALS — BP 99/62 | HR 70 | Temp 97.9°F | Wt 156.0 lb

## 2014-05-13 DIAGNOSIS — L409 Psoriasis, unspecified: Secondary | ICD-10-CM

## 2014-05-13 DIAGNOSIS — R21 Rash and other nonspecific skin eruption: Secondary | ICD-10-CM

## 2014-05-13 MED ORDER — CLOBETASOL PROPIONATE 0.05 % EX OINT: TOPICAL_OINTMENT | CUTANEOUS | Status: AC

## 2014-05-13 NOTE — Assessment & Plan Note (Signed)
Obviously psoriasis on exam today.  No responding to medium potency topical steroids so will switch to two week course of clobetasol.  Would ideally like to use calcipotriene topical in conjunction with this, however, she does not have insurance and medication runs around $500 per month.  As well, [facility] and f/u in two weeks.

## 2014-05-13 NOTE — Patient Instructions (Signed)
Psoriasis (Psoriasis) La psoriasis es una inflamacin de la piel frecuente y crnica (de larga duracin). Afecta a hombres y mujeres por igual, en todas las edades y razas. No puede transmitirse de persona a persona (no es contagiosa). La psoriasis vara desde formas leves a graves. Cuando es grave, puede afectar su calidad de vida. La psoriasis es un trastorno inflamatorio que afecta a la piel as como a otros rganos, incluyendo las articulaciones (y causa artritis). En las personas con psoriasis, la piel pierde sus capas superiores ms rpidamente que en una persona sana.  CAUSAS  La causa es desconocida. Los factores genticos, el sistema inmunolgico y el ambiente parecen cumplir un papel en la causa de la psoriasis. Los factores que empeoran el problema son:   Lesiones o traumatismos como cortes, raspones y quemaduras de sol. Estos traumatismos causan nuevas zonas de psoriasis (lesiones).  La sequedad que produce el invierno y la falta de luz solar.  Los medicamentos como el litio, los betabloqueantes, medicamentos para la malaria, inhibidores de la enzima convertidora de angiotensina (ECA), antinflamatorios no esteroides (ibuprofeno, aspirina) y terbinafina. Hgale saber al profesional que lo asiste si toma alguno de estos medicamentos.  Alcohol Hay que evitar el consumo excesivo de alcohol. El consumo de grandes cantidades de alcohol puede afectar:  El xito del tratamiento.  La seguridad del tratamiento.  El hbito de fumar Si fuma, consulte con el profesional acerca de las opciones para dejar de hacerlo.  El estrs.  Infecciones virales o bacterianas.  Artritis La artritis asociada a la psoriasis (artritis psorisica) involucra a menos del 10% de los pacientes con psoriasis. La intensidad de la artritis no siempre se corresponde con la intensidad de la psoriasis. Es importante que informe a su mdico si le duelen las articulaciones o estn rgidas. SNTOMAS  La forma ms comn  comienza con pequeos bultos rojos que gradualmente se agrandan. Los bultos comienzan a formar escamas que se desprenden con facilidad. Las capas ms profundas de las escamas estn unidas. Cuando se rascan o se despegan, la piel subyacente est sensible y sangra con facilidad. Estas zonas van aumentando su tamao y pueden ser muy grandes. La psoriasis forma una erupcin que es similar en ambos lados del cuerpo (es simtrica). Generalmente afecta a los codos, rodillas, ingle, genitales, brazos, piernas, cuero cabelludo y uas. Las uas afectadas generalmente tienen marcas, se aflojan, se espesan y se escaman, y son difciles de tratar.   La psoriasis inversa aparece en las axilas, debajo de las mamas, en los pliegues de la piel y en la ingle, nalgas y genitales.  La psoriasis en gotas se presenta en los nios y adultos jvenes luego de un dolor de garganta angina estreptocccica). Comienza con pequeas manchas rojas y escamosas en la piel. Desaparece espontneamente en algunas semanas o en pocos meses, sin tratamiento. DIAGNSTICO  La psoriasis se diagnostica con un examen fsico. Le tomarn una muestra (biopsia) de tejido.  TRATAMIENTO  El tratamiento para esta enfermedad depende de la edad, el estado de salud y las condiciones de vida.   Podr utilizar cremas, lociones y ungentos con (corticoides). Estos tratamientos se asocian al adelgazamiento de la piel, aumento del tamao de los vasos sanguneos (dilatacin), prdida de la pigmentacin de la piel y hematomas que aparecen fcilmente. Es importante tomar todos los medicamentos segn las indicaciones del mdico. Solo deben tratarse las zonas afectadas y no la piel normal. Las personas que se encuentran bajo tratamiento con corticoides a largo plazo deben usar un brazalete   de alerta mdico. En las zonas difciles de tratar se aplicarn inyecciones.  Existen tratamientos para el cuero cabelludo en forma de champs, soluciones, aerosoles, espumas y  aceites. Evite rascarse y quitarse las costras.  En las zonas difciles de tratar se usar antralina. Pero mancha la ropa y la piel, y puede causar irritacin temporaria.  En las zonas pequeas puede utilizarse Vitamina D sinttica ( calcipotriol). Se vende bajo prescripcin mdica. Las formas de vitamina D sinttica disponibles en las tiendas de alimentos no mejoran la psoriasis.  Cuando es difcil de tratar, puede usarse una crema de alquitrn de hulla que viene en diferentes concentraciones. Este ha sido uno de los tratamientos ms importantes utilizados durante mucho tiempo en la resolucin de las psoriasis difciles de tratar. Sin embargo, su utilizacin es complicada.  La terapia lumnica(Terapia con rayos UV) puede llevarse a cabo cuidadosamente y de manera profesional en el consultorio del dermatlogo. Para muchas personas es beneficioso tomar baos de sol, con la autorizacin del mdico. La exposicin debe ser lo suficientemente prolongada como para causar un enrojecimiento leve (eritema)en la piel. Evite las quemaduras de sol, ya que pueden hacer que el trastorno empeore. La pantalla solar (filtro de 30 o ms) debe usarse para protegerse de las quemaduras. La terapia solar tiene algunos efectos secundarios, ya que puede ocasionar cataratas, arrugas y envejecimiento de la piel.  Si las cremas (medicamentos tpicos)fracasan, hay otras opciones de medicamentos sistmicos o por va oral que el mdico puede recomendar. En algunos casos la psoriasis pueden ser difcil de tratar. Puede irse y volver. Es necesario realizar controles con el mdico regularmente si su caso es difcil de tratar. Generalmente, con mucha paciencia, usted podr conseguir alivio en un gran porcentaje. Mantener un tratamiento consistente es muy importante. No cambie de un profesional a otro slo porque no obtenga resultados inmediatos. Es necesario realizar numerosos intentos hasta hallar la correcta combinacin de mtodos para  su tratamiento.  PREVENCIN DE LOS BROTES   Use guantes al lavar los platos, al limpiar y cuando est afuera en el fro.  Si tiene radiadores, coloque un recipiente con agua o una toalla hmeda sobre el mismo. Esto har que se humedezca el aire. Tambin puede usar un humidificador. Trate de mantener la humedad en un 60% dentro de su casa.  Aplique un humectante cuando la piel est hmeda luego del bao. Esto atrapar el agua en la piel.  Evite los baos o las duchas calientes prolongados. Use el mnimo de jabn. Los jabones secan la piel y quitan los aceites protectores. Use un jabn sin perfume y sin colorantes.  Beba gran cantidad de lquido para mantener la orina de tono claro o color amarillo plido. Al no beber la cantidad de lquido suficiente se disminuye el suministro de agua a la piel.  Apague la calefaccin durante la noche y mantngala baja durante el da. El aire fro seca menos la piel. SOLICITE ATENCIN MDICA SI:   Aumenta el dolor en la zona de la herida.  Tiene un sangrado que no puede controlar.  Aumentan el enrojecimiento y el calor en la zona de la herida.  Presenta algn dolor o hinchazn en las articulaciones.  Se siente deprimido por su enfermedad.  Tiene fiebre. Document Released: 05/03/2005 Document Revised: 10/16/2011 ExitCare Patient Information 2015 [employer]. This information is not intended to replace advice given to you by your health care provider. Make sure you discuss any questions you have with your health care provider.  

## 2014-05-13 NOTE — Progress Notes (Signed)
Moses Mannersnali Aguilar-Puga is a 33 y.o. female who presents today for B/L extensor surface rash around elbows/knees.   Rash - B/L Elbows, has been there for a yr, previously had about 15 yrs ago, tried cream for this which helped (unsure type).  Sometimes pruritic, does have bleeding when scratches, does not spread, no changes in soaps, detergents, or shampoos.  Also has on her knees, which are very mild.  She has tried some OTC creams which have not helped.  She denies FHx of anything like this, joint pain, myalgias, fevers.  Pt has been on kenalog ointment for the past 3 months and does not really notice a difference with this.  However, she is not always compliant with this but states improvement in pruritis and scaling when she uses this.     No past medical history on file.  History  Smoking status  . Never Smoker   Smokeless tobacco  . Never Used    Family History  Problem Relation Age of Onset  . Hypertension Mother   . Heart attack Father   . Hypertension Father     Current Outpatient Prescriptions on File Prior to Visit  Medication Sig Dispense Refill  . Prenatal Vit-Fe Fumarate-FA (PRENATAL MULTIVITAMIN) TABS Take 1 tablet by mouth daily at 12 noon.      . triamcinolone (KENALOG) 0.025 % ointment Apply 1 application topically 2 (two) times daily.  454 g  3   No current facility-administered medications on file prior to visit.    ROS: Per HPI.  All other systems reviewed and are negative.   Physical Exam Filed Vitals:   05/13/14 1034  BP: 99/62  Pulse: 70  Temp: 97.9 F (36.6 C)    Physical Examination: General appearance - alert, well appearing, and in no distress Heart - normal rate, regular rhythm, normal S1, S2, no murmurs, rubs, clicks or gallops Abdomen - soft, nontender, nondistended, no masses or organomegaly Skin - circumscribed plaques on the extensor surface of elbow and knee w/ scaly patches on anterior knee surfaces B/L.  No other rashes.     >50% of the  25 minute visit was spent counseling and discussing management options with the pt.

## 2014-05-19 ENCOUNTER — Ambulatory Visit: Payer: Self-pay

## 2014-05-19 ENCOUNTER — Other Ambulatory Visit: Payer: Self-pay | Admitting: Family Medicine

## 2014-05-19 ENCOUNTER — Encounter: Payer: Self-pay | Admitting: Family Medicine

## 2014-05-19 DIAGNOSIS — K029 Dental caries, unspecified: Secondary | ICD-10-CM

## 2014-05-19 NOTE — Progress Notes (Signed)
Referral in if she needs it.  Thanks [employer] CorporationBryan R. Paulina FusiHess, DO of [facility] 05/19/2014, 5:17 PM

## 2014-05-19 NOTE — Progress Notes (Unsigned)
Patient is needing a referral to the dentist.  She needs some fillings done and has the orange card.

## 2014-05-21 ENCOUNTER — Encounter: Payer: Self-pay | Admitting: *Deleted

## 2014-05-28 ENCOUNTER — Encounter: Payer: Self-pay | Admitting: Family Medicine

## 2014-05-28 ENCOUNTER — Ambulatory Visit (INDEPENDENT_AMBULATORY_CARE_PROVIDER_SITE_OTHER): Payer: Medicaid Other | Admitting: Family Medicine

## 2014-05-28 VITALS — BP 107/56 | HR 74 | Temp 98.3°F | Ht 63.0 in | Wt 155.7 lb

## 2014-05-28 DIAGNOSIS — R21 Rash and other nonspecific skin eruption: Secondary | ICD-10-CM

## 2014-05-28 NOTE — Assessment & Plan Note (Signed)
Psoriasis, Plaque - Resolved with high dose Clobetasol cream - [facility] - PRN clobetasol now along with topical moisturizers  - F/U in one month to see how doing  - Consider Calcipotriene topical if able to afford/[facility]

## 2014-05-28 NOTE — Progress Notes (Signed)
[name] is a 33 y.o. female who presents today for B/L extensor surface rash around elbows/knees.   Rash - B/L Elbows, has been there for over a yr, previously had about 15 yrs ago, tried cream for this which helped (unsure type).  Sometimes pruritic, does have bleeding when scratches, does not spread, no changes in soaps, detergents, or shampoos.  Also has on her knees, which are very mild.  She has tried some OTC creams which have not helped.  She denies FHx of anything like this, joint pain, myalgias, fevers.  Previously on Kenalog ointment which did not seem to help despite compliance.  Since last visit, she was placed on Clobetasol ointment BID for the last two weeks, which has helped immensely.  As well, [facility], which she heard back from for appointment on 09/23/14.    No past medical history on file.  History  Smoking status  . Never Smoker   Smokeless tobacco  . Never Used    Family History  Problem Relation Age of Onset  . Hypertension Mother   . Heart attack Father   . Hypertension Father     Current Outpatient Prescriptions on File Prior to Visit  Medication Sig Dispense Refill  . clobetasol ointment (TEMOVATE) 0.05 % Apply two times daily to elbows and knees for two weeks straight.  60 g  3  . Prenatal Vit-Fe Fumarate-FA (PRENATAL MULTIVITAMIN) TABS Take 1 tablet by mouth daily at 12 noon.       No current facility-administered medications on file prior to visit.    ROS: Per HPI.  All other systems reviewed and are negative.   Physical Exam Filed Vitals:   05/28/14 1638  BP: 107/56  Pulse: 74  Temp: 98.3 F (36.8 C)    Physical Examination: General appearance - alert, well appearing, and in no distress Heart - normal rate, regular rhythm, normal S1, S2, no murmurs, rubs, clicks or gallops Abdomen - soft, nontender, nondistended, no masses or organomegaly Skin - Slightly hypopigmented macules on the extensor elbows,  improved since last visit

## 2014-06-08 ENCOUNTER — Encounter: Payer: Self-pay | Admitting: Family Medicine

## 2014-08-03 ENCOUNTER — Ambulatory Visit: Payer: Self-pay | Admitting: Family Medicine

## 2014-08-11 ENCOUNTER — Ambulatory Visit (INDEPENDENT_AMBULATORY_CARE_PROVIDER_SITE_OTHER): Payer: Self-pay | Admitting: *Deleted

## 2014-08-11 ENCOUNTER — Ambulatory Visit (INDEPENDENT_AMBULATORY_CARE_PROVIDER_SITE_OTHER): Payer: Self-pay | Admitting: Family Medicine

## 2014-08-11 ENCOUNTER — Encounter: Payer: Self-pay | Admitting: Family Medicine

## 2014-08-11 VITALS — BP 94/70 | HR 70 | Temp 98.2°F | Ht 63.0 in | Wt 156.3 lb

## 2014-08-11 DIAGNOSIS — Z23 Encounter for immunization: Secondary | ICD-10-CM

## 2014-08-11 DIAGNOSIS — R21 Rash and other nonspecific skin eruption: Secondary | ICD-10-CM

## 2014-08-11 NOTE — Patient Instructions (Signed)
Psoriasis (Psoriasis) La psoriasis es una inflamacin de la piel frecuente y crnica (de larga duracin). Afecta a hombres y mujeres por igual, en todas las edades y razas. No puede transmitirse de persona a persona (no es contagiosa). La psoriasis vara desde formas leves a graves. Cuando es grave, puede afectar su calidad de vida. La psoriasis es un trastorno inflamatorio que afecta a la piel as como a otros rganos, incluyendo las articulaciones (y causa artritis). En las personas con psoriasis, la piel pierde sus capas superiores ms rpidamente que en una persona sana.  CAUSAS  La causa es desconocida. Los factores genticos, el sistema inmunolgico y el ambiente parecen cumplir un papel en la causa de la psoriasis. Los factores que empeoran el problema son:   Lesiones o traumatismos como cortes, raspones y quemaduras de sol. Estos traumatismos causan nuevas zonas de psoriasis (lesiones).  La sequedad que produce el invierno y la falta de luz solar.  Los medicamentos como el litio, los betabloqueantes, medicamentos para la malaria, inhibidores de la enzima convertidora de angiotensina (ECA), antinflamatorios no esteroides (ibuprofeno, aspirina) y terbinafina. Hgale saber al profesional que lo asiste si toma alguno de estos medicamentos.  Alcohol Hay que evitar el consumo excesivo de alcohol. El consumo de grandes cantidades de alcohol puede afectar:  El xito del tratamiento.  La seguridad del tratamiento.  El hbito de fumar Si fuma, consulte con el profesional acerca de las opciones para dejar de hacerlo.  El estrs.  Infecciones virales o bacterianas.  Artritis La artritis asociada a la psoriasis (artritis psorisica) involucra a menos del 10% de los pacientes con psoriasis. La intensidad de la artritis no siempre se corresponde con la intensidad de la psoriasis. Es importante que informe a su mdico si le duelen las articulaciones o estn rgidas. SNTOMAS  La forma ms comn  comienza con pequeos bultos rojos que gradualmente se agrandan. Los bultos comienzan a formar escamas que se desprenden con facilidad. Las capas ms profundas de las escamas estn unidas. Cuando se rascan o se despegan, la piel subyacente est sensible y sangra con facilidad. Estas zonas van aumentando su tamao y pueden ser muy grandes. La psoriasis forma una erupcin que es similar en ambos lados del cuerpo (es simtrica). Generalmente afecta a los codos, rodillas, ingle, genitales, brazos, piernas, cuero cabelludo y uas. Las uas afectadas generalmente tienen marcas, se aflojan, se espesan y se escaman, y son difciles de tratar.   La psoriasis inversa aparece en las axilas, debajo de las mamas, en los pliegues de la piel y en la ingle, nalgas y genitales.  La psoriasis en gotas se presenta en los nios y adultos jvenes luego de un dolor de garganta angina estreptocccica). Comienza con pequeas manchas rojas y escamosas en la piel. Desaparece espontneamente en algunas semanas o en pocos meses, sin tratamiento. DIAGNSTICO  La psoriasis se diagnostica con un examen fsico. Le tomarn una muestra (biopsia) de tejido.  TRATAMIENTO  El tratamiento para esta enfermedad depende de la edad, el estado de salud y las condiciones de vida.   Podr utilizar cremas, lociones y ungentos con (corticoides). Estos tratamientos se asocian al adelgazamiento de la piel, aumento del tamao de los vasos sanguneos (dilatacin), prdida de la pigmentacin de la piel y hematomas que aparecen fcilmente. Es importante tomar todos los medicamentos segn las indicaciones del mdico. Solo deben tratarse las zonas afectadas y no la piel normal. Las personas que se encuentran bajo tratamiento con corticoides a largo plazo deben usar un brazalete   de alerta mdico. En las zonas difciles de tratar se aplicarn inyecciones.  Existen tratamientos para el cuero cabelludo en forma de champs, soluciones, aerosoles, espumas y  aceites. Evite rascarse y quitarse las costras.  En las zonas difciles de tratar se usar antralina. Pero mancha la ropa y la piel, y puede causar irritacin temporaria.  En las zonas pequeas puede utilizarse Vitamina D sinttica ( calcipotriol). Se vende bajo prescripcin mdica. Las formas de vitamina D sinttica disponibles en las tiendas de alimentos no mejoran la psoriasis.  Cuando es difcil de tratar, puede usarse una crema de alquitrn de hulla que viene en diferentes concentraciones. Este ha sido uno de los tratamientos ms importantes utilizados durante mucho tiempo en la resolucin de las psoriasis difciles de tratar. Sin embargo, su utilizacin es complicada.  La terapia lumnica(Terapia con rayos UV) puede llevarse a cabo cuidadosamente y de manera profesional en el consultorio del dermatlogo. Para muchas personas es beneficioso tomar baos de sol, con la autorizacin del mdico. La exposicin debe ser lo suficientemente prolongada como para causar un enrojecimiento leve (eritema)en la piel. Evite las quemaduras de sol, ya que pueden hacer que el trastorno empeore. La pantalla solar (filtro de 30 o ms) debe usarse para protegerse de las quemaduras. La terapia solar tiene algunos efectos secundarios, ya que puede ocasionar cataratas, arrugas y envejecimiento de la piel.  Si las cremas (medicamentos tpicos)fracasan, hay otras opciones de medicamentos sistmicos o por va oral que el mdico puede recomendar. En algunos casos la psoriasis pueden ser difcil de tratar. Puede irse y volver. Es necesario realizar controles con el mdico regularmente si su caso es difcil de tratar. Generalmente, con mucha paciencia, usted podr conseguir alivio en un gran porcentaje. Mantener un tratamiento consistente es muy importante. No cambie de un profesional a otro slo porque no obtenga resultados inmediatos. Es necesario realizar numerosos intentos hasta hallar la correcta combinacin de mtodos para  su tratamiento.  PREVENCIN DE LOS BROTES   Use guantes al lavar los platos, al limpiar y cuando est afuera en el fro.  Si tiene radiadores, coloque un recipiente con agua o una toalla hmeda sobre el mismo. Esto har que se humedezca el aire. Tambin puede usar un humidificador. Trate de mantener la humedad en un 60% dentro de su casa.  Aplique un humectante cuando la piel est hmeda luego del bao. Esto atrapar el agua en la piel.  Evite los baos o las duchas calientes prolongados. Use el mnimo de jabn. Los jabones secan la piel y quitan los aceites protectores. Use un jabn sin perfume y sin colorantes.  Beba gran cantidad de lquido para mantener la orina de tono claro o color amarillo plido. Al no beber la cantidad de lquido suficiente se disminuye el suministro de agua a la piel.  Apague la calefaccin durante la noche y mantngala baja durante el da. El aire fro seca menos la piel. SOLICITE ATENCIN MDICA SI:   Aumenta el dolor en la zona de la herida.  Tiene un sangrado que no puede controlar.  Aumentan el enrojecimiento y el calor en la zona de la herida.  Presenta algn dolor o hinchazn en las articulaciones.  Se siente deprimido por su enfermedad.  Tiene fiebre. Document Released: 05/03/2005 Document Revised: 10/16/2011 ExitCare Patient Information 2015 [employer]. This information is not intended to replace advice given to you by your health care provider. Make sure you discuss any questions you have with your health care provider.  

## 2014-08-11 NOTE — Assessment & Plan Note (Signed)
Psoriasis, Plaque - Continue to use Clobetasol cream PRN along with Eucerin to affected areas.   - [facility] - Consider Calcipotriene topical if able to afford/[facility]  - Will f/u in about 3-6 months to see how doing.

## 2014-08-11 NOTE — Progress Notes (Signed)
[name] is a 34 y.o. female who presents today for B/L extensor surface rash around elbows/knees.   Rash - B/L Elbows, has been there for over a yr, previously had about 15 yrs ago, tried cream for this which helped (unsure type).  Sometimes pruritic, does have bleeding when scratches, does not spread, no changes in soaps, detergents, or shampoos.  Also has on her knees, which are very mild.  She has tried some OTC creams which have not helped.  She denies FHx of anything like this, joint pain, myalgias, fevers.  Previously on Kenalog ointment which did not seem to help despite compliance.  Pt being followed by us for now while she awaits appointment at dermatology for her most likely psoriasis.  She has done well on Clobetasol PRN for now and also has used topical Eucerin.  Scheduled appointment for 09/23/14 at dermatology.    History  Smoking status  . Never Smoker   Smokeless tobacco  . Never Used    Family History  Problem Relation Age of Onset  . Hypertension Mother   . Heart attack Father   . Hypertension Father     Current Outpatient Prescriptions on File Prior to Visit  Medication Sig Dispense Refill  . clobetasol ointment (TEMOVATE) 0.05 % Apply two times daily to elbows and knees for two weeks straight. 60 g 3  . Prenatal Vit-Fe Fumarate-FA (PRENATAL MULTIVITAMIN) TABS Take 1 tablet by mouth daily at 12 noon.     No current facility-administered medications on file prior to visit.    ROS: Per HPI.  All other systems reviewed and are negative.   Physical Exam Filed Vitals:   08/11/14 1359  BP: 94/70  Pulse: 70  Temp: 98.2 F (36.8 C)    Physical Examination: General appearance - alert, well appearing, and in no distress Heart - normal rate, regular rhythm, normal S1, S2, no murmurs, rubs, clicks or gallops Abdomen - soft, nontender, nondistended, no masses or organomegaly Skin - Slightly hypopigmented macules on the extensor elbows, improved since last  visit

## 2014-09-17 ENCOUNTER — Ambulatory Visit: Payer: Self-pay

## 2014-09-25 ENCOUNTER — Ambulatory Visit: Payer: Self-pay | Admitting: Family Medicine

## 2014-12-30 ENCOUNTER — Ambulatory Visit: Payer: Self-pay

## 2015-03-31 ENCOUNTER — Ambulatory Visit: Payer: Self-pay

## 2015-04-01 ENCOUNTER — Ambulatory Visit: Payer: Self-pay

## 2016-06-07 ENCOUNTER — Ambulatory Visit: Payer: Self-pay | Attending: Internal Medicine

## 2016-06-16 ENCOUNTER — Ambulatory Visit: Payer: Self-pay | Admitting: Family Medicine

## 2016-10-30 ENCOUNTER — Ambulatory Visit (INDEPENDENT_AMBULATORY_CARE_PROVIDER_SITE_OTHER): Payer: Self-pay | Admitting: Family Medicine

## 2016-10-30 ENCOUNTER — Other Ambulatory Visit (HOSPITAL_COMMUNITY)
Admission: RE | Admit: 2016-10-30 | Discharge: 2016-10-30 | Disposition: A | Discharge status: Home / Self Care | Payer: Self-pay | Source: Ambulatory Visit | Attending: Family Medicine | Admitting: Family Medicine

## 2016-10-30 ENCOUNTER — Encounter: Payer: Self-pay | Admitting: Family Medicine

## 2016-10-30 VITALS — BP 99/70 | HR 72 | Temp 98.4°F | Ht 63.0 in | Wt 145.2 lb

## 2016-10-30 DIAGNOSIS — L298 Other pruritus: Secondary | ICD-10-CM

## 2016-10-30 DIAGNOSIS — Z124 Encounter for screening for malignant neoplasm of cervix: Secondary | ICD-10-CM

## 2016-10-30 DIAGNOSIS — N3000 Acute cystitis without hematuria: Secondary | ICD-10-CM

## 2016-10-30 DIAGNOSIS — N898 Other specified noninflammatory disorders of vagina: Secondary | ICD-10-CM

## 2016-10-30 DIAGNOSIS — D509 Iron deficiency anemia, unspecified: Secondary | ICD-10-CM

## 2016-10-30 DIAGNOSIS — Z01419 Encounter for gynecological examination (general) (routine) without abnormal findings: Secondary | ICD-10-CM

## 2016-10-30 DIAGNOSIS — G5601 Carpal tunnel syndrome, right upper limb: Secondary | ICD-10-CM

## 2016-10-30 LAB — POCT URINALYSIS DIPSTICK
Bilirubin, UA: NEGATIVE
Glucose, UA: NEGATIVE
Ketones, UA: NEGATIVE
Leukocytes, UA: NEGATIVE
Nitrite, UA: POSITIVE
PH UA: 7 (ref 5.0–8.0)
PROTEIN UA: NEGATIVE
RBC UA: NEGATIVE
SPEC GRAV UA: 1.015 (ref 1.030–1.035)
UROBILINOGEN UA: 0.2 (ref ?–2.0)

## 2016-10-30 LAB — POCT UA - MICROSCOPIC ONLY

## 2016-10-30 LAB — POCT WET PREP (WET MOUNT)
CLUE CELLS WET PREP WHIFF POC: NEGATIVE
TRICHOMONAS WET PREP HPF POC: ABSENT

## 2016-10-30 MED ORDER — NAPROXEN 500 MG PO TABS: 500.0000 mg | ORAL_TABLET | Freq: Two times a day (BID) | ORAL | 0 refills | Status: DC

## 2016-10-30 MED ORDER — CEPHALEXIN 500 MG PO CAPS: 500.0000 mg | ORAL_CAPSULE | Freq: Two times a day (BID) | ORAL | 0 refills | Status: DC

## 2016-10-30 NOTE — Progress Notes (Signed)
[name] is a 36 y.o. female presents to office today for annual physical exam examination.  Concerns today include:  [employer] video interpreter [place], [place] [id number] used for Spanish translation of this visit  1. Vaginal Discharge Patient reports that discharge started 3 months ago.  She notes that discharge appears white.  She endorses vaginal odors.  She endorses vaginal pruritis, dysuria.  She denies abnormal vaginal bleeding, hematuria, pelvic pain, nausea, vomiting, fevers.   She has used cranberry tablets/ juice with little relief.   No history of STIs.  She is sexually active and does not use condoms.  Contraception: BTL.  No LMP recorded.  Last pap smear: 2014, normal  No past medical history on file. Social History   Social History  . Marital status: Married    Spouse name: N/A  . Number of children: N/A  . Years of education: N/A   Occupational History  . Not on file.   Social History Main Topics  . Smoking status: Never Smoker  . Smokeless tobacco: Never Used  . Alcohol use Not on file  . Drug use: Unknown  . Sexual activity: Not on file   Other Topics Concern  . Not on file   Social History Narrative  . No narrative on file   Past Surgical History:  Procedure Laterality Date  . CESAREAN SECTION  2003  . CESAREAN SECTION  2005  . CESAREAN SECTION WITH BILATERAL TUBAL LIGATION Bilateral 04/09/2013   Procedure: REPEAT CESAREAN SECTION WITH BILATERAL TUBAL LIGATION;  Surgeon: Hayley Bores, MD;  Location: [facility];  Service: Obstetrics;  Laterality: Bilateral;   Family History  Problem Relation Age of Onset  . Hypertension Mother   . Heart attack Father   . Hypertension Father     ROS: Review of Systems Constitutional: negative Eyes: negative Ears, nose, mouth, throat, and face: negative Respiratory: negative Cardiovascular: negative Gastrointestinal: negative Genitourinary:positive for vaginal discharge and dysuria Integument/breast:  negative Hematologic/lymphatic: negative Musculoskeletal:negative Neurological: negative Behavioral/Psych: negative Endocrine: negative Allergic/Immunologic: negative    Physical exam BP 99/70 (BP Location: Left Arm, Patient Position: Sitting, Cuff Size: Normal)   Pulse 72   Temp 98.4 F (36.9 C) (Oral)   Ht 5\' 3"  (1.6 m)   Wt 145 lb 3.2 oz (65.9 kg)   LMP 10/30/2016   SpO2 98%   BMI 25.72 kg/m  General appearance: alert, cooperative, appears stated age and no distress Head: Normocephalic, without obvious abnormality, atraumatic Eyes: negative findings: lids and lashes normal, conjunctivae and sclerae normal, corneas clear and pupils equal, round, reactive to light and accomodation Ears: normal TM's and external ear canals both ears Nose: Nares normal. Septum midline. Mucosa normal. No drainage or sinus tenderness. Throat: lips, mucosa, and tongue normal; teeth and gums normal Neck: no adenopathy, supple, symmetrical, trachea midline and thyroid not enlarged, symmetric, no tenderness/mass/nodules Back: symmetric, no curvature. ROM normal. No CVA tenderness. Lungs: clear to auscultation bilaterally Heart: regular rate and rhythm, S1, S2 normal, no murmur, click, rub or gallop Abdomen: soft, non-tender; bowel sounds normal; no masses,  no organomegaly Pelvic: cervix normal in appearance, external genitalia normal, no adnexal masses or tenderness, no cervical motion tenderness, rectovaginal septum normal, uterus normal size, shape, and consistency and moderate brown/ grey discharge Extremities: extremities normal, atraumatic, no cyanosis or edema Pulses: 2+ and symmetric Skin: Skin color, texture, turgor normal. No rashes or lesions Lymph nodes: Cervical, supraclavicular, and axillary nodes normal. Neurologic: Grossly normal   Results for orders placed or performed  in visit on 10/30/16 (from the past 24 hour(s))  POCT UA - Microscopic Only     Status: Abnormal   Collection Time:  10/30/16  2:41 PM  Result Value Ref Range   WBC, Ur, HPF, POC 0-3    RBC, urine, microscopic NONE    Bacteria, U Microscopic 3+ RODS    Epithelial cells, urine per micros 1-5   Urinalysis Dipstick     Status: None   Collection Time: 10/30/16  2:57 PM  Result Value Ref Range   Color, UA yellow    Clarity, UA slightly cloudy    Glucose, UA neg    Bilirubin, UA neg    Ketones, UA neg    Spec Grav, UA 1.015 1.030 - 1.035   Blood, UA neg    pH, UA 7.0 5.0 - 8.0   Protein, UA neg    Urobilinogen, UA 0.2 Negative - 2.0   Nitrite, UA positive    Leukocytes, UA Negative Negative   Narrative   Reflex to microscopic  POCT Wet Prep [name]([place])     Status: Abnormal   Collection Time: 10/30/16  2:59 PM  Result Value Ref Range   Source Wet Prep POC Vaginal    WBC, Wet Prep HPF POC 5-10    Bacteria Wet Prep HPF POC Moderate (A) Few   Clue Cells Wet Prep HPF POC None None   Clue Cells Wet Prep Whiff POC Negative Whiff    Yeast Wet Prep HPF POC None    Trichomonas Wet Prep HPF POC Absent Absent    Assessment/ Plan: [name] here for annual physical exam.   1. Well woman exam with routine gynecological exam - Cytology - PAP w/ HPV and GC/CT testing  2. Iron deficiency anemia, unspecified iron deficiency anemia type - CBC  3. Screening for cervical cancer - Cytology - PAP  4. Vaginal itching.  No evidence of yeast/ BV on exam. - POCT Wet Prep [facility]([place])  5. Acute cystitis without hematuria - Keflex 500mg  BID x7 days - Urinalysis Dipstick - Urine culture - POCT UA - Microscopic Only  6. Carpal tunnel syndrome of right wrist - Wrist bracing at night recommended - Naprosyn 500 mg BID w/food prn - Ice - If no improvement in 6 weeks, [facility]  Follow up prn  [name] M. Hayley CountsGottschalk, DO PGY-3, [facility]

## 2016-10-30 NOTE — Patient Instructions (Addendum)
Recomiendo detener el Advil / Motrin / Ibuprofen / Aleve / Naproxen. Le he recetado un medicamento para que Conservation officer, historic buildings en el brazo derecho. Tambin puedes usar hielo en el rea todos los [place]. Le recomiendo que compre una muequera y que la use solo por la noche. Esto ayudar al dolor. Si siente que el dolor no mejora despus de 6 semanas de uso, regrese para Naval architect y lo remitiremos a un especialista para una ecografa y Ardelia Mems posible inyeccin de esteroides.   Le enviar una copia de sus resultados. Si hay algo anormal, te llamar.  Sndrome del tnel carpiano (Carpal Tunnel Syndrome) El sndrome del tnel carpiano es una afeccin que causa dolor en la mano y en el brazo. El tnel carpiano es un espacio estrecho ubicado en el lado palmar de la [place]. Los movimientos de la [place] o ciertas enfermedades pueden causar hinchazn del tnel. Esta hinchazn comprime el nervio principal de la mueca (nervio mediano). CAUSAS Esta afeccin puede ser causada por lo siguiente:  Movimientos repetidos de [employer].  Lesiones en la [place].  Artritis.  Un quiste o un tumor en el tnel carpiano.  Acumulacin de lquido Solicitor. A veces, se desconoce la causa de esta afeccin. FACTORES DE RIESGO Es ms probable que esta afeccin se manifieste en:  Las personas que tienen trabajos en los que deben [employer]s mismos movimientos repetidos de las [place], como los carniceros y los cajeros.  Las mujeres.  Las personas que tienen determinadas enfermedades, por ejemplo:  Diabetes.  Obesidad.  Tiroides hipoactiva (hipotiroidismo).  Insuficiencia renal. SNTOMAS Los sntomas de esta afeccin incluyen lo siguiente:  Sensacin de hormigueo en los dedos de la mano, Production assistant, radio, el ndice y el dedo [place].  Hormigueo o adormecimiento en la mano.  Sensacin de Social research officer, government en todo el brazo, especialmente cuando la [place] y el codo estn flexionados durante [place].  Dolor en la mueca que sube por el brazo hasta el hombro.  Dolor que baja por la mano o los dedos.  Sensacin de ArvinMeritor. Tal vez tenga dificultad para tomar y Licensed conveyancer. Los sntomas pueden empeorar durante la noche. DIAGNSTICO Esta afeccin se diagnostica mediante la historia clnica y un examen fsico. Tambin pueden hacerle exmenes, que incluyen los siguientes:  Electromiografa (EMG). Esta prueba mide las seales elctricas que los nervios les envan a los msculos.  Radiografas. TRATAMIENTO El tratamiento de esta afeccin incluye lo siguiente:  Cambios en el estilo de vida. Es importante dejar de Field seismologist o modificar la actividad que caus la afeccin.  Fisioterapia o terapia ocupacional.  Analgsicos y antiinflamatorios. Esto puede incluir medicamentos que se inyectan en la [place].  Una frula para la [place].  [place]. INSTRUCCIONES PARA EL CUIDADO EN EL HOGAR Si tiene una frula:  sela como se lo haya indicado el mdico. Qutesela solamente como se lo haya indicado el mdico.  Afloje la frula si los dedos se le entumecen, siente hormigueos o se le enfran y se tornan de Optician, dispensing.  Mantenga la frula limpia y seca. Instrucciones generales  [employer] de venta libre y los recetados solamente como se lo haya indicado el mdico.  Haga reposar la [place] de toda actividad que le cause dolor. Si la afeccin tiene relacin con Leander Rams, hable con su empleador [employer] pueden [place], por Commerce, usar una almohadilla para apoyar la mueca mientras tipea.  Si se lo indican, aplique hielo sobre la  zona dolorida:  Ponga el hielo en una bolsa plstica.  Coloque una toalla entre la piel y la bolsa de hielo.  Coloque el hielo durante 60mnutos, 2 a 3veces por dTraining and development officer  Concurra a todas las visitas de control como se lo haya indicado el mdico. Esto es importante.  Haga los ejercicios como se lo hayan indicado el  mdico, el fisioterapeuta o el terapeuta ocupacional. SOLICITE ATENCIN MDICA SI:  Aparecen nuevos sntomas.  El dolor no se alivia con los mDynegy  Los sntomas empeoran. Esta informacin no tiene cMarine scientistel consejo del mdico. Asegrese de hacerle al mdico cualquier pregunta que tenga. Document Released: 07/24/2005 Document Revised: 11/15/2015 Document Reviewed: 12/09/2014 Elsevier Interactive Patient Education  2017 EMar-Mac(Health Maintenance, Female) Un estilo de vida saludable y los cuidados preventivos pueden favorecer considerablemente a la salud y eMusician Pregunte a su mdico cul es el cronograma de exmenes peridicos apropiado para usted. Esta es una buena oportunidad para consultarlo sobre cmo prevenir enfermedades y mLarkspursano. Adems de los controles, hay muchas otras cosas que puede hacer usted mismo. Los expertos han realizado numerosas investigaciones sArvinMeritorcambios en el estilo de vida y las medidas de prevencin que, m[place]arr[place] lo ayudarn a mantenerse sano. Solicite a su mdico ms informacin. EL PESO Y LA DIETA Consuma una dieta saludable.  Asegrese de iFamily Dollar Storesverduras, frutas, productos lcteos de bajo contenido de gDjiboutiy pAdvertising account planner  No consuma muchos alimentos de alto contenido de grasas slidas, azcares agregados o sal.  Realice actividad fsica con regularidad. Esta es una de las prcticas ms importantes que puede hacer por su salud.  La mayora de los adultos deben hacer ejercicio durante al menos 1545mutos por semana. El ejercicio debe aumentar la frecuencia cardaca y prActora transpiracin (ejercicio de inKlamath  La mayora de los adultos tambin deben haField seismologistjercicios de elongacin al meToysRuseces a la semana. Agregue esto al su plan de ejercicio de intensidad moderada. Mantenga un peso saludable.  El ndice de masa corporal  (ISouth Suburban Surgical Suiteses una medida que puede utilizarse para identificar posibles problemas de peOak RidgeProporciona una estimacin de la grasa corporal basndose en el peso y la altura. Su mdico puede ayudarle a deRadiation protection practitionerMJefferson a loScientist, forensic maTheatre managern peso saludable.  Para las mujeres de 20aos o ms:  Un IMMotion Picture And Television Hospitalenor de 18,5 se considera bajo peso.  Un IMAnne Arundel Surgery Center Pasadenantre 18,5 y 24,9 es normal.  Un IMSavoy Medical Centerntre 25 y 29,9 se considera sobrepeso.  Un IMC de 30 o ms se considera obesidad. Observe los niveles de colesterol y lpidos en la sangre.  Debe comenzar a reEnglish as a second language teachere lpidos y coResearch officer, trade unionn la sangre a los 20aos y luego repetirlos cada 5a106aos Es posible que neAutomotive engineeros niveles de colesterol con mayor frecuencia si:  Sus niveles de lpidos y colesterol son altos.  Es mayor de 50aos.  Presenta un alto riesgo de padecer enfermedades cardacas. DETECCIN DE CNCER Cncer de pulmn  Se recomienda realizar exmenes de deteccin de cncer de pulmn a personas adultas entre [id number] que estn en riesgo de deHorticulturist, commerciale pulmn por sus antecedentes de consumo de tabaco.  Se recomienda una tomografa computarizada de baja dosis de los puLiberty Mediaos a las personas que:  Fuman actualmente.  Hayan dejado el hbito en algn momento en los ltimos 15aos.  Hayan fumado durante 30aos un  paquete diario. Un paquete-ao equivale a fumar un promedio de un paquete de cigarrillos diario durante un ao.  Los exmenes de deteccin anuales deben continuar hasta que hayan pasado 15aos desde que dej de fumar.  Ya no debern realizarse si tiene un problema de salud que le impida recibir tratamiento para Science writer de pulmn. Cncer de mama  Practique la autoconciencia de la mama. Esto significa reconocer la apariencia normal de sus mamas y cmo las siente.  Tambin significa realizar autoexmenes regulares de [employer]. Informe a su mdico sobre cualquier cambio, sin  importar cun pequeo sea.  Si tiene entre 20 y 54 aos, un mdico debe realizarle un examen clnico de las mamas como parte del examen regular de [place], cada 1 a 3aos.  Si tiene 40aos o ms, debe Information systems manager clnico de las Microsoft. Tambin considere realizarse una [place]) todos los [place].  Si tiene antecedentes familiares de cncer de mama, hable con su mdico para someterse a un estudio gentico.  Si tiene alto riesgo de Chief Financial Officer de mama, hable con su mdico para someterse a Public house manager y [employer].  La evaluacin del gen del cncer de mama (BRCA) se recomienda a mujeres que tengan familiares con cnceres relacionados con el BRCA. Los cnceres relacionados con el BRCA incluyen los siguientes:  Mama.  Ovario.  Trompas.  Cnceres de peritoneo.  Los resultados de la evaluacin determinarn la necesidad de asesoramiento gentico y de [place] de BRCA1 y BRCA2. Cncer de cuello del tero El mdico puede recomendarle que se haga pruebas peridicas de deteccin de cncer de los rganos de la pelvis (ovarios, tero y vagina). Estas pruebas incluyen un examen plvico, que abarca controlar si se produjeron cambios microscpicos en la superficie del cuello del tero (prueba de Papanicolaou). Pueden recomendarle que se haga estas pruebas cada 3aos, a partir de los 21aos.  A las mujeres que tienen entre 30 y 72aos, los mdicos pueden recomendarles que se sometan a exmenes plvicos y pruebas de Papanicolaou cada 15aos, o a la prueba de Papanicolaou y el examen plvico en combinacin con estudios de deteccin del virus del papiloma humano (VPH) cada 5aos. Algunos tipos de VPH aumentan el riesgo de Chief Financial Officer de cuello del tero. La prueba para la deteccin del VPH tambin puede realizarse a mujeres de cualquier edad cuyos resultados de la prueba de Papanicolaou no sean claros.  Es posible que  otros mdicos no recomienden exmenes de deteccin a mujeres no embarazadas que se consideran sujetos de bajo riesgo de Chief Financial Officer de pelvis y que no tienen sntomas. Pregntele al mdico si un examen plvico de deteccin es adecuado para usted.  Si ha recibido un tratamiento para Science writer cervical o una enfermedad que podra causar cncer, necesitar realizarse una prueba de Papanicolaou y controles durante al menos 74 aos de concluido el [place]. Si no se ha hecho el Papanicolaou con regularidad, debern volver a evaluarse los factores de riesgo (como tener un nuevo compaero sexual), para Teacher, adult education si debe realizarse los estudios nuevamente. Algunas mujeres sufren problemas mdicos que aumentan la probabilidad de Museum/gallery curator cncer de cuello del tero. En estos casos, el mdico podr [employer] se realicen controles y pruebas de Papanicolaou con ms frecuencia. Cncer colorrectal  Este tipo de cncer puede detectarse y a menudo prevenirse.  Por lo general, los estudios de rutina se deben Medical laboratory scientific officer a Field seismologist a Proofreader de los 76 aos y [place]  los 75 aos.  Sin embargo, el mdico podr aconsejarle que lo haga antes, si tiene factores de riesgo para el cncer de colon.  Tambin puede recomendarle que use un kit de prueba para Hydrologist en la materia fecal.  Es posible que se use una pequea cmara en el extremo de un tubo para examinar directamente el colon (sigmoidoscopia o colonoscopia) a fin de Hydrographic surveyor formas tempranas de cncer colorrectal.  Los exmenes de rutina generalmente comienzan a los 64aos.  El examen directo del colon se debe repetir cada 5 a 10aos hasta los 75aos. Sin embargo, es posible que se realicen exmenes con mayor frecuencia, si se detectan formas tempranas de plipos precancerosos o pequeos bultos. Cncer de piel  Revise la piel de la cabeza a los pies con regularidad.  Informe a su mdico si aparecen nuevos lunares o los que tiene se modifican,  especialmente en su forma y color.  Tambin notifique al mdico si tiene un lunar que es ms grande que el tamao de una goma de lpiz.  Siempre use pantalla solar. Aplique pantalla solar de [place] D[place] y repetida a lo largo del Training and development officer.  Protjase usando mangas y [employer], un sombrero de ala ancha y gafas para el sol, siempre que se encuentre en el exterior. ENFERMEDADES CARDACAS, DIABETES E HIPERTENSIN ARTERIAL  La hipertensin arterial causa enfermedades cardacas y [place] el riesgo de ictus. La hipertensin arterial es ms probable en los siguientes casos:  Las personas que tienen la presin arterial en el extremo del rango normal (100-139/85-89 mm Hg).  Las personas con sobrepeso u obesidad.  Las Retail banker.  Si usted tiene entre 18 y 39 aos, debe medirse la presin arterial cada 3 a 5 aos. Si usted tiene 40 aos o ms, debe medirse la presin arterial [employer]. Debe medirse la presin arterial dos veces: una vez cuando est en un hospital o una clnica y la otra vez cuando est en otro sitio. Registre el promedio de [employer]. Para controlar su presin arterial cuando no est en un hospital o [place], puede usar lo siguiente:  Ardelia Mems mquina automtica para medir la presin arterial en una farmacia.  Un monitor para medir la presin arterial en el hogar.  Si tiene entre 52 y 8 aos, consulte a su mdico si debe tomar aspirina para prevenir el ictus.  Realcese exmenes de deteccin de la diabetes con regularidad. Esto incluye la toma de [place] de sangre para controlar el nivel de azcar en la sangre durante el [place].  Si tiene un peso normal y un bajo riesgo de padecer diabetes, realcese este anlisis cada tres aos despus de los 45aos.  Si tiene sobrepeso y un alto riesgo de padecer diabetes, considere someterse a este anlisis antes o con mayor frecuencia. PREVENCIN DE INFECCIONES HepatitisB  Si tiene un riesgo ms alto de  Museum/gallery curator hepatitis B, debe someterse a un examen de deteccin de este virus. Se considera que tiene un alto riesgo de Museum/gallery curator hepatitis B si:  Naci en un pas donde la hepatitis B es frecuente. Pregntele a su mdico qu pases son considerados de Public affairs consultant.  Sus padres nacieron en un pas de alto riesgo y usted no recibi una vacuna que lo proteja contra la hepatitis B (vacuna contra la hepatitis B).  Paraje.  [place] agujas para inyectarse drogas.  Vive con alguien que tiene hepatitis B.  Ha tenido sexo con alguien que tiene hepatitis B.  Recibe tratamiento de hemodilisis.  Toma ciertos medicamentos para el cncer, trasplante de rganos y afecciones autoinmunitarias. Hepatitis C  Se recomienda un anlisis de [place] para:  Todos los que nacieron entre 1945 y [phone].  Todas las personas que tengan un riesgo de haber contrado hepatitis C. Enfermedades de transmisin sexual (ETS).  Debe realizarse pruebas de deteccin de enfermedades de transmisin sexual (ETS), incluidas gonorrea y clamidia si:  Es sexualmente activo y es menor de 24aos.  Es mayor de 24aos, y Investment banker, operational informa que corre riesgo de tener este tipo de infecciones.  La actividad sexual ha cambiado desde que le hicieron la ltima prueba de deteccin y tiene un riesgo mayor de Best boy clamidia o Radio broadcast assistant. Pregntele al mdico si usted tiene riesgo.  Si no tiene el VIH, pero corre riesgo de infectarse por el virus, se recomienda tomar diariamente un medicamento recetado para evitar la infeccin. Esto se conoce como profilaxis previa a la exposicin. Se considera que est en riesgo si:  Es [place] sexualmente y no [place] preservativos habitualmente o no conoce el estado del VIH de sus Advertising copywriter.  Se inyecta drogas.  Es [place] sexualmente con Ardelia Mems pareja que tiene VIH. Consulte a su mdico para saber si tiene un alto riesgo de infectarse por el VIH. Si opta por comenzar la profilaxis previa a la  exposicin, primero debe realizarse anlisis de deteccin del VIH. Luego, le harn anlisis cada 34mses mientras est tomando los medicamentos para la profilaxis previa a la exposicin. ESyringa Hospital & Clinics Si es premenopusica y puede quedar eGarberville solicite a su mdico asesoramiento previo a la concepcin.  Si puede quedar embarazada, tome 400 a [id number]([id number]) de cido fAnheuser-Busch  Si desea evitar el embarazo, hable con su mdico sobre el control de la natalidad (anticoncepcin). OSTEOPOROSIS Y MENOPAUSIA  La osteoporosis es una enfermedad en la que los huesos pierden los minerales y la fuerza por el avance de la edad. El resultado pueden ser fracturas graves en los hWar El riesgo de osteoporosis puede identificarse con uArdelia Memsprueba de densidad sea.  Si tiene 65aos o ms, o si est en riesgo de sufrir osteoporosis y fracturas, pregunte a su mdico si debe someterse a exmenes.  Consulte a su mdico si debe tomar un suplemento de calcio o de vitamina D para reducir el riesgo de osteoporosis.  La menopausia puede presentar ciertos sntomas fsicos y rGaffer  La terapia de reemplazo hormonal puede reducir algunos de estos sntomas y rGaffer Consulte a su mdico para saber si la terapia de reemplazo hormonal es conveniente para usted. INSTRUCCIONES PARA EL CUIDADO EN EL HOGAR  Realcese los estudios de rutina de la salud, dentales y de lPublic librarian  M[place]  No consuma ningn producto que contenga tabaco, lo que incluye cigarrillos, tabaco de mHigher education careers advisero cPsychologist, sport and exercise  Si est embarazada, no beba alcohol.  Si est amamantando, reduzca el consumo de alcohol y la frecuencia con la que consume.  Si es mujer y no est embarazada limite el consumo de alcohol a no ms de 1 medida por da. Una medida equivale a 12onzas de cerveza, 5onzas de vino o 1onzas de bebidas alcohlicas de alta graduacin.  No consuma drogas.  No comparta  agujas.  Solicite ayuda a su mdico si necesita apoyo o informacin para abandonar las drogas.  Informe a su mdico si a menudo se siente deprimido.  Notifique a su mdico si alguna vez ha sido vctima de abuso  o si no se siente seguro en su hogar. Esta informacin no tiene Marine scientist el consejo del mdico. Asegrese de hacerle al mdico cualquier pregunta que tenga. Document Released: 07/13/2011 Document Revised: 08/14/2014 Document Reviewed: 04/27/2015 Elsevier Interactive Patient Education  2017 [employer].

## 2016-10-31 LAB — CBC
HEMATOCRIT: 36.4 % (ref 34.0–46.6)
Hemoglobin: 11.8 g/dL (ref 11.1–15.9)
MCH: 28.9 pg (ref 26.6–33.0)
MCHC: 32.4 g/dL (ref 31.5–35.7)
MCV: 89 fL (ref 79–97)
PLATELETS: 304 10*3/uL (ref 150–379)
RBC: 4.08 x10E6/uL (ref 3.77–5.28)
RDW: 14.4 % (ref 12.3–15.4)
WBC: 9.1 10*3/uL (ref 3.4–10.8)

## 2016-11-01 ENCOUNTER — Encounter: Payer: Self-pay | Admitting: Family Medicine

## 2016-11-01 LAB — URINE CULTURE

## 2016-11-01 LAB — CYTOLOGY - PAP
ADEQUACY: ABSENT
Chlamydia: NEGATIVE
Diagnosis: NEGATIVE
HPV: NOT DETECTED
NEISSERIA GONORRHEA: NEGATIVE
Trichomonas: NEGATIVE

## 2020-09-25 ENCOUNTER — Other Ambulatory Visit: Payer: Self-pay

## 2020-09-25 ENCOUNTER — Encounter (HOSPITAL_COMMUNITY): Payer: Self-pay | Admitting: Emergency Medicine

## 2020-09-25 ENCOUNTER — Emergency Department (HOSPITAL_COMMUNITY)
Admission: EM | Admit: 2020-09-25 | Discharge: 2020-09-25 | Disposition: A | Discharge status: Home / Self Care | Payer: Self-pay | Attending: Emergency Medicine | Admitting: Emergency Medicine

## 2020-09-25 DIAGNOSIS — R000 Tachycardia, unspecified: Secondary | ICD-10-CM | POA: Insufficient documentation

## 2020-09-25 DIAGNOSIS — Z9851 Tubal ligation status: Secondary | ICD-10-CM | POA: Insufficient documentation

## 2020-09-25 DIAGNOSIS — N12 Tubulo-interstitial nephritis, not specified as acute or chronic: Secondary | ICD-10-CM | POA: Insufficient documentation

## 2020-09-25 DIAGNOSIS — M545 Low back pain, unspecified: Secondary | ICD-10-CM | POA: Insufficient documentation

## 2020-09-25 DIAGNOSIS — R519 Headache, unspecified: Secondary | ICD-10-CM | POA: Insufficient documentation

## 2020-09-25 LAB — LIPASE, BLOOD: Lipase: 23 U/L (ref 11–51)

## 2020-09-25 LAB — URINALYSIS, ROUTINE W REFLEX MICROSCOPIC
Bilirubin Urine: NEGATIVE
Glucose, UA: NEGATIVE mg/dL
Ketones, ur: 5 mg/dL — AB
Nitrite: POSITIVE — AB
Protein, ur: 30 mg/dL — AB
Specific Gravity, Urine: 1.012 (ref 1.005–1.030)
WBC, UA: >50 WBC/hpf — ABNORMAL HIGH (ref 0–5)
pH: 6 (ref 5.0–8.0)

## 2020-09-25 LAB — COMPREHENSIVE METABOLIC PANEL
ALT: 16 U/L (ref 0–44)
AST: 19 U/L (ref 15–41)
Albumin: 4 g/dL (ref 3.5–5.0)
Alkaline Phosphatase: 87 U/L (ref 38–126)
Anion gap: 11 (ref 5–15)
BUN: 10 mg/dL (ref 6–20)
CO2: 19 mmol/L — ABNORMAL LOW (ref 22–32)
Calcium: 9 mg/dL (ref 8.9–10.3)
Chloride: 107 mmol/L (ref 98–111)
Creatinine, Ser: 0.62 mg/dL (ref 0.44–1.00)
GFR, Estimated: >60 mL/min (ref >60)
Glucose, Bld: 118 mg/dL — ABNORMAL HIGH (ref 70–99)
Potassium: 3.6 mmol/L (ref 3.5–5.1)
Sodium: 137 mmol/L (ref 135–145)
Total Bilirubin: 1.2 mg/dL (ref 0.3–1.2)
Total Protein: 7.8 g/dL (ref 6.5–8.1)

## 2020-09-25 LAB — CBC
HCT: 36.4 % (ref 36.0–46.0)
Hemoglobin: 12 g/dL (ref 12.0–15.0)
MCH: 29.3 pg (ref 26.0–34.0)
MCHC: 33 g/dL (ref 30.0–36.0)
MCV: 89 fL (ref 80.0–100.0)
Platelets: 220 10*3/uL (ref 150–400)
RBC: 4.09 MIL/uL (ref 3.87–5.11)
RDW: 14.3 % (ref 11.5–15.5)
WBC: 16.3 10*3/uL — ABNORMAL HIGH (ref 4.0–10.5)
nRBC: 0 % (ref 0.0–0.2)

## 2020-09-25 LAB — I-STAT BETA HCG BLOOD, ED (MC, WL, AP ONLY): I-stat hCG, quantitative: <5 m[IU]/mL (ref <5)

## 2020-09-25 MED ORDER — CEPHALEXIN 500 MG PO CAPS: 500.0000 mg | ORAL_CAPSULE | Freq: Four times a day (QID) | ORAL | 0 refills | Status: DC

## 2020-09-25 MED ORDER — SODIUM CHLORIDE 0.9 % IV SOLN
1.0000 g | Freq: Once | INTRAVENOUS | Status: AC
  Administered 2020-09-25: 1 g via INTRAVENOUS
  Filled 2020-09-25: qty 10

## 2020-09-25 MED ORDER — KETOROLAC TROMETHAMINE 15 MG/ML IJ SOLN
15.0000 mg | Freq: Once | INTRAMUSCULAR | Status: AC
  Administered 2020-09-25: 15 mg via INTRAVENOUS
  Filled 2020-09-25: qty 1

## 2020-09-25 MED ORDER — ACETAMINOPHEN 325 MG PO TABS
650.0000 mg | ORAL_TABLET | Freq: Once | ORAL | Status: AC
  Administered 2020-09-25: 650 mg via ORAL
  Filled 2020-09-25: qty 2

## 2020-09-25 MED ORDER — SODIUM CHLORIDE 0.9 % IV BOLUS
1000.0000 mL | Freq: Once | INTRAVENOUS | Status: AC
  Administered 2020-09-25: 1000 mL via INTRAVENOUS

## 2020-09-25 MED ORDER — ONDANSETRON HCL 4 MG PO TABS: 4.0000 mg | ORAL_TABLET | Freq: Three times a day (TID) | ORAL | 0 refills | Status: DC | PRN

## 2020-09-25 NOTE — ED Triage Notes (Signed)
Patient reports pain with urination x1 week. Reports fever and back pain today. Triage completed using interpreter.

## 2020-09-25 NOTE — ED Provider Notes (Signed)
[facility]-EMERGENCY DEPT Provider Note   CSN: [id number] Arrival date & time: 09/25/20  1332     History Chief Complaint  Patient presents with  . Fever  . Dysuria    [name] is a 40 y.o. female.  The history is provided by the patient. A language interpreter was used.   [name] is a 40 y.o. female who presents to the Emergency Department complaining of fever.  She presents to the ED complaining of painful urination for one week.  Fever started today. She has associated back pain, headache and body aches for two days. She denies any associated cough, shortness of breath, Donald pain, nausea, vomiting, vaginal discharge. She has a history of similar episodes related to urinary tract infection. Back pain is located in bilateral low back, left greater than right. She has been vaccinated for COVID-19. No known sick contacts. She has been taking over-the-counter cranberry urinary relief medication with no significant improvement in symptoms.   History reviewed. No pertinent past medical history.  Patient Active Problem List   Diagnosis Date Noted  . 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 Scaly patch rash 01/23/2014  . Decreased libido 01/23/2014  . S/P C-section 04/18/2013  . Anemia, iron deficiency 12/16/2012    Past Surgical History:  Procedure Laterality Date  . CESAREAN SECTION  2003  . CESAREAN SECTION  2005  . CESAREAN SECTION WITH BILATERAL TUBAL LIGATION Bilateral 04/09/2013   Procedure: REPEAT CESAREAN SECTION WITH BILATERAL TUBAL LIGATION;  Surgeon: Reva Bores, MD;  Location: [facility];  Service: Obstetrics;  Laterality: Bilateral;     OB History    Gravida  3   Para  3   Term  3   Preterm  0   AB  0   Living  3     SAB  0   IAB  0   Ectopic  0   Multiple  0   Live Births  3           Family History  Problem Relation Age of Onset  . Hypertension Mother   . Hyperlipidemia Mother   .  Heart attack Father   . Hypertension Father   . Hyperlipidemia Father   . Hyperlipidemia Son   . Cancer Paternal Grandmother   . Cancer Paternal Grandfather   . Asthma Son     Social History   Tobacco Use  . Smoking status: Never Smoker  . Smokeless tobacco: Never Used  Substance Use Topics  . Alcohol use: No  . Drug use: No    Home Medications Prior to Admission medications   Medication Sig Start Date End Date Taking? Authorizing Provider  cephALEXin (KEFLEX) 500 MG capsule Take 1 capsule (500 mg total) by mouth 4 (four) times daily. 09/25/20  Yes Tilden Fossa, MD  ondansetron (ZOFRAN) 4 MG tablet Take 1 tablet (4 mg total) by mouth every 8 (eight) hours as needed for nausea or vomiting. 09/25/20  Yes Tilden Fossa, MD  clobetasol ointment (TEMOVATE) 0.05 % Apply two times daily to elbows and knees for two weeks straight. Patient not taking: Reported on 09/25/2020 05/13/14   Gildardo Cranker R, DO  naproxen (NAPROSYN) 500 MG tablet Take 1 tablet (500 mg total) by mouth 2 (two) times daily with a meal. Patient not taking: Reported on 09/25/2020 10/30/16   Raliegh Ip, DO  Prenatal Vit-Fe Fumarate-FA (PRENATAL MULTIVITAMIN) TABS Take 1 tablet by mouth daily at 12 noon.  [provider]    Allergies    Patient has no known allergies.  Review of Systems   Review of Systems  All other systems reviewed and are negative.   Physical Exam Updated Vital Signs BP 106/66   Pulse 87   Temp 99 F (37.2 C)   Resp 18   SpO2 97%   Physical Exam Vitals and nursing note reviewed.  Constitutional:      Appearance: She is well-developed and well-nourished.  HENT:     Head: Normocephalic and atraumatic.  Cardiovascular:     Rate and Rhythm: Regular rhythm. Tachycardia present.     Heart sounds: No murmur heard.   Pulmonary:     Effort: Pulmonary effort is normal. No respiratory distress.     Breath sounds: Normal breath sounds.  Abdominal:     Palpations:  Abdomen is soft.     Tenderness: There is no abdominal tenderness. There is no guarding or rebound.     Comments: Left CVA tenderness  Musculoskeletal:        General: No swelling, tenderness or edema.  Skin:    General: Skin is warm and dry.  Neurological:     Mental Status: She is alert and oriented to person, place, and time.  Psychiatric:        Mood and Affect: Mood and affect normal.        Behavior: Behavior normal.     ED Results / Procedures / Treatments   Labs (all labs ordered are listed, but only abnormal results are displayed) Labs Reviewed  COMPREHENSIVE METABOLIC PANEL - Abnormal; Notable for the following components:      Result Value   CO2 19 (*)    Glucose, Bld 118 (*)    All other components within normal limits  CBC - Abnormal; Notable for the following components:   WBC 16.3 (*)    All other components within normal limits  URINALYSIS, ROUTINE W REFLEX MICROSCOPIC - Abnormal; Notable for the following components:   Color, Urine AMBER (*)    Hgb urine dipstick MODERATE (*)    Ketones, ur 5 (*)    Protein, ur 30 (*)    Nitrite POSITIVE (*)    Leukocytes,Ua SMALL (*)    WBC, UA >50 (*)    Bacteria, UA MANY (*)    All other components within normal limits  URINE CULTURE  LIPASE, BLOOD  I-STAT BETA HCG BLOOD, ED ([facility], [facility], [facility] ONLY)    EKG None  Radiology No results found.  Procedures Procedures   Medications Ordered in ED Medications  sodium chloride 0.9 % bolus 1,000 mL (0 mLs Intravenous Stopped 09/25/20 1658)  cefTRIAXone (ROCEPHIN) 1 g in sodium chloride 0.9 % 100 mL IVPB (0 g Intravenous Stopped 09/25/20 1658)  acetaminophen (TYLENOL) tablet 650 mg (650 mg Oral Given 09/25/20 1519)  ketorolac (TORADOL) 15 MG/ML injection 15 mg (15 mg Intravenous Given 09/25/20 1657)    ED Course  I have reviewed the triage vital signs and the nursing notes.  Pertinent labs & imaging results that were available during my care of the patient were reviewed by  me and considered in my medical decision making (see chart for details).    MDM Rules/Calculators/A&P                         patient here for evaluation of dysuria, fever, body aches and headache. She is non-toxic appearing on evaluation. She was febrile on  ED presentation, improved with Tylenol. UA is consistent with UTI and she was treated with antibiotics. She has no vomiting. She is able to tolerate oral's without difficulty. CBC with leukocytosis. She was treated with antibiotics, IV fluids for pyelonephritis. Presentation is not consistent with meningitis, severe sepsis/septic shock, infected kidney stone. Discussed importance of return precautions and outpatient follow-up.  Final Clinical Impression(s) / ED Diagnoses Final diagnoses:  Pyelonephritis    Rx / DC Orders ED Discharge Orders         Ordered    cephALEXin (KEFLEX) 500 MG capsule  4 times daily        09/25/20 1526    ondansetron (ZOFRAN) 4 MG tablet  Every 8 hours PRN        09/25/20 1526           Tilden Fossa, MD 09/25/20 2201

## 2020-09-28 LAB — URINE CULTURE: Culture: >=100000 — AB

## 2020-09-29 ENCOUNTER — Telehealth: Payer: Self-pay | Admitting: Emergency Medicine

## 2020-09-29 NOTE — Telephone Encounter (Signed)
Post ED Visit - Positive Culture Follow-up  Culture report reviewed by antimicrobial stewardship pharmacist: [facility] []  , Pharm.D. []  Enzo Bi, Pharm.D., BCPS AQ-ID []  , Pharm.D., BCPS []  Celedonio Miyamoto, Pharm.D., BCPS []  Hitchcock, Garvin Fila.D., BCPS, AAHIVP []  , Pharm.D., BCPS, AAHIVP []  Georgina Pillion, PharmD, BCPS []  , PharmD, BCPS []  [place], PharmD, BCPS []  [address], PharmD []  , PharmD, BCPS []  Estella Husk, PharmD  Pharmacy Team []  Lysle Pearl, PharmD []  , PharmD []  Phillips Climes, PharmD []  , Rph []  Agapito Games) , PharmD []  Verlan Friends, PharmD []  , PharmD []  Mervyn Gay, PharmD []  , PharmD []  Vinnie Level, PharmD []  [facility], PharmD []  , PharmD []  Len Childs, PharmD   Positive urine culture Treated with cephalexin, organism sensitive to the same and no further patient follow-up is required at this time.  09/29/2020, 9:59 AM

## 2021-07-06 ENCOUNTER — Ambulatory Visit: Payer: Self-pay

## 2022-08-16 ENCOUNTER — Other Ambulatory Visit: Payer: Self-pay | Admitting: Hematology and Oncology

## 2022-08-16 ENCOUNTER — Other Ambulatory Visit: Payer: Self-pay

## 2022-08-16 DIAGNOSIS — Z1231 Encounter for screening mammogram for malignant neoplasm of breast: Secondary | ICD-10-CM

## 2022-08-16 DIAGNOSIS — Z01419 Encounter for gynecological examination (general) (routine) without abnormal findings: Secondary | ICD-10-CM

## 2022-08-16 NOTE — Progress Notes (Signed)
Patient: [name]           Date of Birth: 09-19-1980           MRN: [id number] Visit Date: 08/16/2022 PCP: Patient, No Pcp Per     Cervical Exam Pap smear completed: Pap test Abnormal Observations: Normal exam. Pap obtained.      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  Family History  Problem Relation Age of Onset   Hypertension Mother    Hyperlipidemia Mother    Heart attack Father    Hypertension Father    Hyperlipidemia Father    Hyperlipidemia Son    Cancer Paternal Grandmother    Cancer Paternal Grandfather    Asthma Son     Social History   Occupational History   Not on file  Tobacco Use   Smoking status: Never   Smokeless tobacco: Never  Substance and Sexual Activity   Alcohol use: No   Drug use: No   Sexual activity: Yes    Birth control/protection: Surgical

## 2022-08-17 LAB — CYTOLOGY - PAP
Adequacy: ABSENT
Comment: NEGATIVE
Diagnosis: NEGATIVE
High risk HPV: NEGATIVE

## 2022-08-22 ENCOUNTER — Telehealth: Payer: Self-pay

## 2022-08-22 NOTE — Telephone Encounter (Signed)
With [place] with [employer], Interpreter ID: [id number], we have called the pt and advised her PAP/HPV test was negative and to repeat in 5 years.

## 2022-08-22 NOTE — Telephone Encounter (Signed)
In addition to the voice message I have sent the following letter in [facility] advising the same information.  16 de enero de 2024     Estimada participante:   Gracias por participar en la prueba de deteccin de cncer de cuello uterino del 10 de enero de 2024 patrocinada por la prueba de deteccin de cncer de cuello uterino de [employer] [facility] .  Los resultados de su prueba de Papanicolaou estn dentro de los lmites normales. Es importante que comparta la informacin con su mdico.  El resultado de su prueba del Virus del Papiloma Humano (VPH) tambin result negativo.  Si tiene [employer], no dude en comunicarse con nosotros al [phone] y solicite hablar con Ardelia Mems enfermera.  [facility] Phone: [phone] Fax: [phone]

## 2022-09-07 ENCOUNTER — Ambulatory Visit
Admission: RE | Admit: 2022-09-07 | Discharge: 2022-09-07 | Disposition: A | Discharge status: Home / Self Care | Payer: Self-pay | Source: Ambulatory Visit | Attending: Obstetrics and Gynecology | Admitting: Obstetrics and Gynecology

## 2022-09-07 ENCOUNTER — Ambulatory Visit: Payer: No Typology Code available for payment source | Admitting: Hematology and Oncology

## 2022-09-07 VITALS — BP 124/81 | Wt 154.9 lb

## 2022-09-07 DIAGNOSIS — Z1231 Encounter for screening mammogram for malignant neoplasm of breast: Secondary | ICD-10-CM

## 2022-09-07 NOTE — Patient Instructions (Signed)
Taught [name] about breast self awareness and gave educational materials to take home. Patient did not need a Pap smear today due to last Pap smear was in 2024 per patient.  Let her know B[facility]CP will cover Pap smears every 5 years unless has a history of abnormal Pap smears. Referred patient to the [facility] for screening mammogram. Appointment scheduled for 09/07/22. Patient aware of appointment and will be there. Let patient know will follow up with her within the next couple weeks with results. [name] verbalized understanding.  Hayley Ped, NP 10:50 AM

## 2022-09-07 NOTE — Progress Notes (Signed)
Ms. Chela Sutphen is a 42 y.o. female who presents to [facility] clinic today with no complaints.    Pap Smear: Pap not smear completed today. Last Pap smear was 2024 and was normal. Per patient has history of an abnormal Pap smear. Last Pap smear result is available in Epic.   Physical exam: Breasts Breasts symmetrical. No skin abnormalities bilateral breasts. No nipple retraction bilateral breasts. No nipple discharge bilateral breasts. No lymphadenopathy. No lumps palpated bilateral breasts.       Pelvic/Bimanual Pap is not indicated today    Smoking History: Patient has never smoked and was not referred to quit line.    Patient Navigation: Patient education provided. Access to services provided for patient through [place] interpreter provided. No transportation provided   Colorectal Cancer Screening: Per patient has never had colonoscopy completed No complaints today.    Breast and Cervical Cancer Risk Assessment: Patient has family history of breast cancer, with her paternal grandmother. Patient does not have history of cervical dysplasia, immunocompromised, or DES exposure in-utero.  Risk Assessment   No risk assessment data     A: BCCCP exam without pap smear No complaints with benign exam.   P: Referred patient to the [facility] for a screening mammogram. Appointment scheduled 09/07/22.  Melodye Ped, NP 09/07/2022 10:33 AM

## 2022-09-12 ENCOUNTER — Other Ambulatory Visit: Payer: Self-pay | Admitting: Obstetrics and Gynecology

## 2022-09-12 DIAGNOSIS — R928 Other abnormal and inconclusive findings on diagnostic imaging of breast: Secondary | ICD-10-CM

## 2022-09-26 ENCOUNTER — Ambulatory Visit
Admission: RE | Admit: 2022-09-26 | Discharge: 2022-09-26 | Disposition: A | Discharge status: Home / Self Care | Payer: No Typology Code available for payment source | Source: Ambulatory Visit | Attending: Obstetrics and Gynecology | Admitting: Obstetrics and Gynecology

## 2022-09-26 ENCOUNTER — Other Ambulatory Visit: Payer: Self-pay | Admitting: Obstetrics and Gynecology

## 2022-09-26 DIAGNOSIS — N6489 Other specified disorders of breast: Secondary | ICD-10-CM

## 2022-09-26 DIAGNOSIS — R928 Other abnormal and inconclusive findings on diagnostic imaging of breast: Secondary | ICD-10-CM

## 2023-03-29 ENCOUNTER — Ambulatory Visit
Admission: RE | Admit: 2023-03-29 | Discharge: 2023-03-29 | Disposition: A | Discharge status: Home / Self Care | Payer: No Typology Code available for payment source | Source: Ambulatory Visit | Attending: Obstetrics and Gynecology | Admitting: Obstetrics and Gynecology

## 2023-03-29 ENCOUNTER — Ambulatory Visit: Payer: No Typology Code available for payment source

## 2023-03-29 DIAGNOSIS — N6489 Other specified disorders of breast: Secondary | ICD-10-CM

## 2023-04-02 ENCOUNTER — Other Ambulatory Visit: Payer: Self-pay | Admitting: Obstetrics and Gynecology

## 2023-04-02 DIAGNOSIS — R928 Other abnormal and inconclusive findings on diagnostic imaging of breast: Secondary | ICD-10-CM

## 2023-06-12 ENCOUNTER — Ambulatory Visit: Payer: No Typology Code available for payment source

## 2023-06-12 DIAGNOSIS — Z23 Encounter for immunization: Secondary | ICD-10-CM

## 2023-07-20 LAB — AMB RESULTS CONSOLE CBG: Glucose: 108

## 2023-08-28 ENCOUNTER — Other Ambulatory Visit: Payer: Self-pay

## 2023-08-28 DIAGNOSIS — N6489 Other specified disorders of breast: Secondary | ICD-10-CM

## 2023-10-11 ENCOUNTER — Ambulatory Visit: Payer: Self-pay | Admitting: *Deleted

## 2023-10-11 ENCOUNTER — Ambulatory Visit
Admission: RE | Admit: 2023-10-11 | Discharge: 2023-10-11 | Disposition: A | Discharge status: Home / Self Care | Payer: No Typology Code available for payment source | Source: Ambulatory Visit | Attending: Obstetrics and Gynecology | Admitting: Obstetrics and Gynecology

## 2023-10-11 VITALS — BP 120/80 | Wt 153.0 lb

## 2023-10-11 DIAGNOSIS — N6489 Other specified disorders of breast: Secondary | ICD-10-CM

## 2023-10-11 DIAGNOSIS — Z1239 Encounter for other screening for malignant neoplasm of breast: Secondary | ICD-10-CM

## 2023-10-11 NOTE — Progress Notes (Signed)
 [name] is a 43 y.o. female who presents to [facility] clinic today with no complaints. Patient had a right breast diagnostic mammogram completed 03/29/2023 that a bilateral diagnostic mammogram is recommended in 20-months.   Pap Smear: Pap smear not completed today. Last Pap smear was 08/16/2022 at [facility] and was normal with negative HPV. Per patient has no history of an abnormal Pap smear. Last Pap smear result is available in Epic.   Physical exam: Breasts Right breast is larger than left breast that per patient is normal for her. No skin abnormalities bilateral breasts. No nipple retraction bilateral breasts. No nipple discharge bilateral breasts. No lymphadenopathy. No lumps palpated bilateral breasts. No complaints of pain or tenderness on exam.    MM DIAG BREAST TOMO UNI RIGHT Result Date: 03/29/2023 CLINICAL DATA:  43 year old female presents for six-month follow-up of RIGHT breast asymmetry, previously without sonographic correlate. EXAM: DIGITAL DIAGNOSTIC UNILATERAL RIGHT MAMMOGRAM WITH TOMOSYNTHESIS AND CAD TECHNIQUE: Right digital diagnostic mammography and breast tomosynthesis was performed. The images were evaluated with computer-aided detection. COMPARISON:  Previous exam(s). ACR Breast Density Category c: The breasts are heterogeneously dense, which may obscure small masses. FINDINGS: Full field and spot compression views of the RIGHT breast demonstrate no significant change in the asymmetry(ies) within the posterior OUTER RIGHT breast. IMPRESSION: No significant change in likely benign RIGHT breast asymmetry, previously without sonographic correlate. Six-month follow-up recommended to ensure 1 year stability. RECOMMENDATION: Bilateral diagnostic mammogram in 6 months. I have discussed the findings and recommendations with the patient. If applicable, a reminder letter will be sent to the patient regarding the next appointment. BI-RADS CATEGORY  3:  Probably benign. Electronically Signed   By: Harmon Pier M.D.   On: 03/29/2023 09:20   MS DIGITAL DIAG TOMO UNI RIGHT Result Date: 09/26/2022 CLINICAL DATA:  43 year old female presenting as a recall from baseline screening for possible right breast asymmetry. EXAM: DIGITAL DIAGNOSTIC UNILATERAL RIGHT MAMMOGRAM WITH TOMOSYNTHESIS; ULTRASOUND RIGHT BREAST LIMITED TECHNIQUE: Right digital diagnostic mammography and breast tomosynthesis was performed.; Targeted ultrasound examination of the right breast was performed COMPARISON:  Previous exam(s). ACR Breast Density Category c: The breasts are heterogeneously dense, which may obscure small masses. FINDINGS: Mammogram: Right breast: Spot compression tomosynthesis views of the right breast were performed for a questioned asymmetry in the outer posterior right breast. On the additional imaging there is a persistent asymmetry which partially effaces and most likely represents normal fibroglandular tissue. On physical exam of the outer right breast I do not feel a discrete mass or focal area of thickening. Ultrasound: Targeted ultrasound is performed in the outer aspect of the right breast demonstrating no cystic or solid mass to correspond to the asymmetry identified mammographically. IMPRESSION: Probably benign asymmetry without sonographic correlate in the outer right breast. RECOMMENDATION: Diagnostic right breast mammogram in 6 months. I have discussed the findings and recommendations with the patient who agrees to short-term follow-up. If applicable, a reminder letter will be sent to the patient regarding the next appointment. BI-RADS CATEGORY  3: Probably benign. Electronically Signed   By: Emmaline Kluver M.D.   On: 09/26/2022 09:40  MS DIGITAL SCREENING TOMO BILATERAL Addendum Date: 09/11/2022 ADDENDUM REPORT: 09/11/2022 12:32 ADDENDUM: This is an addendum to this baseline screening mammogram performed on 09/07/2022. COMPARISON:  None ACR breast density: C:  The breast tissue is heterogeneously dense, which may obscure small masses. FINDINGS: In the right breast a possible asymmetry warrants further evaluation. No abnormality is seen in  the left breast. IMPRESSION: Further evaluation of a possible asymmetry in the right breast is recommended. Recommendation: Diagnostic mammogram and possible ultrasound of the right breast is recommended. BI-RADS category: 0: Incomplete. Need additional imaging evaluation and/or prior mammograms for comparison. Electronically Signed   By: Baird Lyons M.D.   On: 09/11/2022 12:32   Result Date: 09/11/2022 CLINICAL DATA:  Screening. EXAM: DIGITAL SCREENING BILATERAL MAMMOGRAM WITH TOMOSYNTHESIS AND CAD TECHNIQUE: Bilateral screening digital craniocaudal and mediolateral oblique mammograms were obtained. Bilateral screening digital breast tomosynthesis was performed. The images were evaluated with computer-aided detection. COMPARISON:  Previous exam(s). ACR Breast Density Category c: The breast tissue is heterogeneously dense, which may obscure small masses. FINDINGS: In the left breast, a possible mass warrants further evaluation. In the right breast, no findings suspicious for malignancy. IMPRESSION: Further evaluation is suggested for a possible mass in the left breast. RECOMMENDATION: Diagnostic mammogram and possibly ultrasound of the left breast. (Code:[id number]-[id number]) The patient will be contacted regarding the findings, and additional imaging will be scheduled. BI-RADS CATEGORY  0: Incomplete. Need additional imaging evaluation and/or prior mammograms for comparison. Electronically Signed: By: Baird Lyons M.D. On: 09/11/2022 12:19  Pelvic/Bimanual Pap is not indicated today per BCCCP guidelines.   Smoking History: Patient has never smoked.   Patient Navigation: Patient education provided. Access to services provided for patient through [place] program. Spanish interpreter [name] from [facility] provided.   Breast and Cervical  Cancer Risk Assessment: Patient has family history of paternal grandmother having breast cancer. Patient has no known genetic mutations or history of radiation treatment to the chest before age 3. Patient does not have history of cervical dysplasia, immunocompromised, or DES exposure in-utero.  Risk Scores as of Encounter on 10/11/2023     [name]           5-year 0.48%   Lifetime 9.29%            Last calculated by Caprice Red, CMA on 10/11/2023 at  8:53 AM        A: BCCCP exam without pap smear No complaints.  P: Referred [facility] to the [facility] for a diagnostic mammogram per recommendations. Appointment scheduled Thursday, October 11, 2023 at 1030.  Priscille Heidelberg, RN 10/11/2023 9:12 AM

## 2023-10-11 NOTE — Patient Instructions (Signed)
 Explained breast self awareness with [name]. Patient did not need a Pap smear today due to last Pap smear and HPV typing was 08/16/2022. Let her know BCCCP will cover Pap smears and HPV typing every 5 years unless has a history of abnormal Pap smears. Referred patient to the [facility] for a diagnostic mammogram per recommendations. Appointment scheduled Thursday, October 11, 2023 at 1030. Patient aware of appointment and will be there. [name] verbalized understanding.  Darroll Bredeson, Kathaleen Maser, RN 9:12 AM
